# Patient Record
Sex: Female | Born: 1965 | Race: White | Hispanic: No | Marital: Married | Smoking: Former smoker
Health system: Southern US, Community
[De-identification: ages and names within clinical notes are randomized; demographics above are authoritative.]

## PROBLEM LIST (undated history)

## (undated) DIAGNOSIS — E611 Iron deficiency: Secondary | ICD-10-CM

## (undated) DIAGNOSIS — F32A Depression, unspecified: Secondary | ICD-10-CM

## (undated) DIAGNOSIS — F419 Anxiety disorder, unspecified: Secondary | ICD-10-CM

## (undated) DIAGNOSIS — I10 Essential (primary) hypertension: Secondary | ICD-10-CM

## (undated) DIAGNOSIS — G43909 Migraine, unspecified, not intractable, without status migrainosus: Secondary | ICD-10-CM

## (undated) DIAGNOSIS — D649 Anemia, unspecified: Secondary | ICD-10-CM

## (undated) DIAGNOSIS — D759 Disease of blood and blood-forming organs, unspecified: Secondary | ICD-10-CM

## (undated) DIAGNOSIS — G709 Myoneural disorder, unspecified: Secondary | ICD-10-CM

## (undated) DIAGNOSIS — J849 Interstitial pulmonary disease, unspecified: Secondary | ICD-10-CM

## (undated) DIAGNOSIS — I209 Angina pectoris, unspecified: Secondary | ICD-10-CM

## (undated) DIAGNOSIS — E785 Hyperlipidemia, unspecified: Secondary | ICD-10-CM

## (undated) DIAGNOSIS — F329 Major depressive disorder, single episode, unspecified: Secondary | ICD-10-CM

## (undated) DIAGNOSIS — R0602 Shortness of breath: Secondary | ICD-10-CM

## (undated) DIAGNOSIS — I2510 Atherosclerotic heart disease of native coronary artery without angina pectoris: Secondary | ICD-10-CM

## (undated) DIAGNOSIS — R011 Cardiac murmur, unspecified: Secondary | ICD-10-CM

## (undated) DIAGNOSIS — G8929 Other chronic pain: Secondary | ICD-10-CM

## (undated) DIAGNOSIS — M549 Dorsalgia, unspecified: Secondary | ICD-10-CM

## (undated) DIAGNOSIS — J45909 Unspecified asthma, uncomplicated: Secondary | ICD-10-CM

## (undated) DIAGNOSIS — Z87440 Personal history of urinary (tract) infections: Secondary | ICD-10-CM

## (undated) DIAGNOSIS — R42 Dizziness and giddiness: Secondary | ICD-10-CM

## (undated) DIAGNOSIS — J439 Emphysema, unspecified: Secondary | ICD-10-CM

## (undated) DIAGNOSIS — Z8614 Personal history of Methicillin resistant Staphylococcus aureus infection: Secondary | ICD-10-CM

## (undated) DIAGNOSIS — Z87442 Personal history of urinary calculi: Secondary | ICD-10-CM

## (undated) DIAGNOSIS — J449 Chronic obstructive pulmonary disease, unspecified: Secondary | ICD-10-CM

## (undated) DIAGNOSIS — J189 Pneumonia, unspecified organism: Secondary | ICD-10-CM

## (undated) DIAGNOSIS — K219 Gastro-esophageal reflux disease without esophagitis: Secondary | ICD-10-CM

## (undated) DIAGNOSIS — N951 Menopausal and female climacteric states: Secondary | ICD-10-CM

## (undated) DIAGNOSIS — T8859XA Other complications of anesthesia, initial encounter: Secondary | ICD-10-CM

## (undated) DIAGNOSIS — T4145XA Adverse effect of unspecified anesthetic, initial encounter: Secondary | ICD-10-CM

## (undated) HISTORY — DX: Hyperlipidemia, unspecified: E78.5

## (undated) HISTORY — PX: ABDOMINAL HYSTERECTOMY: SHX81

## (undated) HISTORY — DX: Depression, unspecified: F32.A

## (undated) HISTORY — DX: Iron deficiency: E61.1

## (undated) HISTORY — DX: Major depressive disorder, single episode, unspecified: F32.9

## (undated) HISTORY — DX: Atherosclerotic heart disease of native coronary artery without angina pectoris: I25.10

## (undated) HISTORY — DX: Anemia, unspecified: D64.9

## (undated) HISTORY — DX: Anxiety disorder, unspecified: F41.9

## (undated) HISTORY — DX: Migraine, unspecified, not intractable, without status migrainosus: G43.909

## (undated) HISTORY — DX: Other chronic pain: G89.29

## (undated) HISTORY — DX: Essential (primary) hypertension: I10

## (undated) HISTORY — DX: Personal history of Methicillin resistant Staphylococcus aureus infection: Z86.14

## (undated) HISTORY — DX: Cardiac murmur, unspecified: R01.1

## (undated) HISTORY — DX: Menopausal and female climacteric states: N95.1

## (undated) HISTORY — DX: Personal history of urinary (tract) infections: Z87.440

## (undated) HISTORY — DX: Pneumonia, unspecified organism: J18.9

## (undated) HISTORY — DX: Interstitial pulmonary disease, unspecified: J84.9

## (undated) HISTORY — DX: Dizziness and giddiness: R42

## (undated) HISTORY — DX: Personal history of urinary calculi: Z87.442

## (undated) HISTORY — DX: Dorsalgia, unspecified: M54.9

## (undated) HISTORY — PX: LUNG BIOPSY: SHX232

## (undated) HISTORY — DX: Gastro-esophageal reflux disease without esophagitis: K21.9

---

## 1987-07-28 HISTORY — PX: TONSILLECTOMY: SUR1361

## 1998-07-27 HISTORY — PX: OTHER SURGICAL HISTORY: SHX169

## 2003-07-28 HISTORY — PX: BREAST BIOPSY: SHX20

## 2004-07-27 HISTORY — PX: CHOLECYSTECTOMY: SHX55

## 2004-08-22 ENCOUNTER — Emergency Department: Payer: Self-pay | Admitting: Unknown Physician Specialty

## 2004-12-26 ENCOUNTER — Ambulatory Visit: Payer: Self-pay | Admitting: Urology

## 2005-01-19 ENCOUNTER — Emergency Department: Payer: Self-pay | Admitting: General Practice

## 2005-05-21 ENCOUNTER — Ambulatory Visit: Payer: Self-pay | Admitting: Family Medicine

## 2006-04-10 ENCOUNTER — Emergency Department: Payer: Self-pay | Admitting: Unknown Physician Specialty

## 2006-09-21 ENCOUNTER — Emergency Department: Payer: Self-pay | Admitting: Unknown Physician Specialty

## 2007-03-17 ENCOUNTER — Other Ambulatory Visit: Payer: Self-pay

## 2007-03-17 ENCOUNTER — Emergency Department: Payer: Self-pay | Admitting: Internal Medicine

## 2007-11-07 ENCOUNTER — Emergency Department: Payer: Self-pay | Admitting: Emergency Medicine

## 2007-12-30 ENCOUNTER — Other Ambulatory Visit: Payer: Self-pay

## 2007-12-30 ENCOUNTER — Emergency Department: Payer: Self-pay | Admitting: Emergency Medicine

## 2008-05-03 ENCOUNTER — Encounter: Payer: Self-pay | Admitting: Family Medicine

## 2008-06-05 ENCOUNTER — Emergency Department: Payer: Self-pay | Admitting: Emergency Medicine

## 2008-07-27 HISTORY — PX: CARDIAC CATHETERIZATION: SHX172

## 2008-08-21 ENCOUNTER — Ambulatory Visit: Payer: Self-pay | Admitting: Family Medicine

## 2008-08-21 DIAGNOSIS — G43909 Migraine, unspecified, not intractable, without status migrainosus: Secondary | ICD-10-CM | POA: Insufficient documentation

## 2008-08-21 DIAGNOSIS — F329 Major depressive disorder, single episode, unspecified: Secondary | ICD-10-CM

## 2008-08-21 DIAGNOSIS — D649 Anemia, unspecified: Secondary | ICD-10-CM

## 2008-08-21 DIAGNOSIS — F411 Generalized anxiety disorder: Secondary | ICD-10-CM | POA: Insufficient documentation

## 2008-08-21 DIAGNOSIS — F17200 Nicotine dependence, unspecified, uncomplicated: Secondary | ICD-10-CM

## 2008-08-21 DIAGNOSIS — E785 Hyperlipidemia, unspecified: Secondary | ICD-10-CM

## 2008-08-21 DIAGNOSIS — G5790 Unspecified mononeuropathy of unspecified lower limb: Secondary | ICD-10-CM | POA: Insufficient documentation

## 2008-08-24 ENCOUNTER — Ambulatory Visit: Payer: Self-pay | Admitting: Family Medicine

## 2008-08-24 LAB — CONVERTED CEMR LAB
ALT: 14 units/L (ref 0–35)
AST: 18 units/L (ref 0–37)
Alkaline Phosphatase: 56 units/L (ref 39–117)
BUN: 12 mg/dL (ref 6–23)
Bilirubin Urine: NEGATIVE
Bilirubin, Direct: 0.1 mg/dL (ref 0.0–0.3)
Calcium: 11.4 mg/dL — ABNORMAL HIGH (ref 8.4–10.5)
Creatinine, Ser: 1 mg/dL (ref 0.4–1.2)
Direct LDL: 213.5 mg/dL
Folate: 13.5 ng/mL
GFR calc Af Amer: 78 mL/min
HCT: 38 % (ref 36.0–46.0)
MCV: 95.6 fL (ref 78.0–100.0)
Nitrite: NEGATIVE
Phosphorus: 4.5 mg/dL (ref 2.3–4.6)
Potassium: 3.8 meq/L (ref 3.5–5.1)
RBC: 3.97 M/uL (ref 3.87–5.11)
TSH: 1.45 microintl units/mL (ref 0.35–5.50)
Triglycerides: 1088 mg/dL (ref 0–149)
Vitamin B-12: 438 pg/mL (ref 211–911)
WBC: 16.1 10*3/uL — ABNORMAL HIGH (ref 4.5–10.5)
Yeast, UA: 0
pH: 6

## 2008-08-25 ENCOUNTER — Encounter: Payer: Self-pay | Admitting: Family Medicine

## 2008-08-31 ENCOUNTER — Telehealth: Payer: Self-pay | Admitting: Family Medicine

## 2008-08-31 ENCOUNTER — Ambulatory Visit: Payer: Self-pay | Admitting: Family Medicine

## 2008-08-31 DIAGNOSIS — K219 Gastro-esophageal reflux disease without esophagitis: Secondary | ICD-10-CM | POA: Insufficient documentation

## 2008-08-31 DIAGNOSIS — D72829 Elevated white blood cell count, unspecified: Secondary | ICD-10-CM | POA: Insufficient documentation

## 2008-09-03 ENCOUNTER — Encounter: Payer: Self-pay | Admitting: Family Medicine

## 2008-09-06 ENCOUNTER — Ambulatory Visit: Payer: Self-pay | Admitting: Oncology

## 2008-09-06 LAB — CONVERTED CEMR LAB
Basophils Absolute: 0.1 10*3/uL (ref 0.0–0.1)
Eosinophils Absolute: 0.5 10*3/uL (ref 0.0–0.7)
HCT: 37.7 % (ref 36.0–46.0)
Hemoglobin: 12.3 g/dL (ref 12.0–15.0)
Lymphocytes Relative: 26 % (ref 12–46)
MCHC: 32.6 g/dL (ref 30.0–36.0)
MCV: 93.3 fL (ref 78.0–100.0)
Monocytes Absolute: 1.1 10*3/uL — ABNORMAL HIGH (ref 0.1–1.0)
Platelets: 451 10*3/uL — ABNORMAL HIGH (ref 150–400)
WBC: 17.2 10*3/uL — ABNORMAL HIGH (ref 4.0–10.5)

## 2008-09-18 ENCOUNTER — Encounter: Payer: Self-pay | Admitting: Family Medicine

## 2008-09-18 LAB — MORPHOLOGY - CHCC SATELLITE
Platelet Morphology: NORMAL
RBC Comments: NORMAL

## 2008-09-18 LAB — CMP (CANCER CENTER ONLY)
ALT(SGPT): 18 U/L (ref 10–47)
Albumin: 3.6 g/dL (ref 3.3–5.5)
CO2: 27 mEq/L (ref 18–33)
Calcium: 9.8 mg/dL (ref 8.0–10.3)
Chloride: 103 mEq/L (ref 98–108)
Glucose, Bld: 122 mg/dL — ABNORMAL HIGH (ref 73–118)
Potassium: 4.5 mEq/L (ref 3.3–4.7)
Sodium: 137 mEq/L (ref 128–145)
Total Protein: 8.1 g/dL (ref 6.4–8.1)

## 2008-09-18 LAB — CBC WITH DIFFERENTIAL (CANCER CENTER ONLY)
Eosinophils Absolute: 0.5 10*3/uL (ref 0.0–0.5)
HCT: 38.1 % (ref 34.8–46.6)
HGB: 13 g/dL (ref 11.6–15.9)
LYMPH%: 33.6 % (ref 14.0–48.0)
MCV: 91 fL (ref 81–101)
MONO#: 0.7 10*3/uL (ref 0.1–0.9)
NEUT%: 56.2 % (ref 39.6–80.0)
Platelets: 448 10*3/uL — ABNORMAL HIGH (ref 145–400)
RBC: 4.18 10*6/uL (ref 3.70–5.32)
WBC: 14 10*3/uL — ABNORMAL HIGH (ref 3.9–10.0)

## 2008-09-20 LAB — SPEP & IFE WITH QIG
Albumin ELP: 44.9 % — ABNORMAL LOW (ref 55.8–66.1)
Alpha-2-Globulin: 13.3 % — ABNORMAL HIGH (ref 7.1–11.8)
IgA: 170 mg/dL (ref 68–378)
IgG (Immunoglobin G), Serum: 1030 mg/dL (ref 694–1618)
Total Protein, Serum Electrophoresis: 7.8 g/dL (ref 6.0–8.3)

## 2008-09-20 LAB — IRON AND TIBC
%SAT: 12 % — ABNORMAL LOW (ref 20–55)
TIBC: 374 ug/dL (ref 250–470)

## 2008-09-20 LAB — FERRITIN: Ferritin: 25 ng/mL (ref 10–291)

## 2008-09-20 LAB — VITAMIN B12: Vitamin B-12: 738 pg/mL (ref 211–911)

## 2008-09-20 LAB — FOLATE: Folate: >20 ng/mL

## 2008-09-21 LAB — LACTATE DEHYDROGENASE: LDH: 118 U/L (ref 94–250)

## 2008-09-21 LAB — JAK2 GENOTYPR

## 2008-09-25 ENCOUNTER — Encounter: Payer: Self-pay | Admitting: Family Medicine

## 2008-09-27 ENCOUNTER — Ambulatory Visit: Payer: Self-pay | Admitting: Family Medicine

## 2008-09-27 LAB — UIFE/LIGHT CHAINS/TP QN, 24-HR UR
Albumin, U: DETECTED
Beta, Urine: DETECTED — AB
Free Kappa Lt Chains,Ur: 1.86 mg/dL — ABNORMAL HIGH (ref 0.04–1.51)
Free Lambda Excretion/Day: 2.67 mg/d
Free Lambda Lt Chains,Ur: 0.13 mg/dL (ref 0.08–1.01)
Free Lt Chn Excr Rate: 38.13 mg/d
Gamma Globulin, Urine: DETECTED — AB
Time: 24 hours
Volume, Urine: 2050 mL

## 2008-10-03 ENCOUNTER — Telehealth (INDEPENDENT_AMBULATORY_CARE_PROVIDER_SITE_OTHER): Payer: Self-pay | Admitting: *Deleted

## 2008-10-08 ENCOUNTER — Telehealth: Payer: Self-pay | Admitting: Family Medicine

## 2008-10-10 ENCOUNTER — Ambulatory Visit: Payer: Self-pay | Admitting: Family Medicine

## 2008-10-23 ENCOUNTER — Ambulatory Visit: Payer: Self-pay | Admitting: Oncology

## 2008-10-23 LAB — CBC WITH DIFFERENTIAL (CANCER CENTER ONLY)
Eosinophils Absolute: 0.3 10*3/uL (ref 0.0–0.5)
LYMPH#: 3 10*3/uL (ref 0.9–3.3)
MONO#: 0.7 10*3/uL (ref 0.1–0.9)
NEUT#: 7.5 10*3/uL — ABNORMAL HIGH (ref 1.5–6.5)
Platelets: 449 10*3/uL — ABNORMAL HIGH (ref 145–400)
RBC: 4.03 10*6/uL (ref 3.70–5.32)
WBC: 11.6 10*3/uL — ABNORMAL HIGH (ref 3.9–10.0)

## 2008-10-30 ENCOUNTER — Telehealth: Payer: Self-pay | Admitting: Family Medicine

## 2008-10-30 DIAGNOSIS — M549 Dorsalgia, unspecified: Secondary | ICD-10-CM | POA: Insufficient documentation

## 2008-11-05 ENCOUNTER — Ambulatory Visit: Payer: Self-pay | Admitting: Family Medicine

## 2008-11-05 ENCOUNTER — Encounter: Payer: Self-pay | Admitting: Family Medicine

## 2008-11-06 ENCOUNTER — Ambulatory Visit: Payer: Self-pay | Admitting: Family Medicine

## 2008-11-06 DIAGNOSIS — E559 Vitamin D deficiency, unspecified: Secondary | ICD-10-CM | POA: Insufficient documentation

## 2008-11-14 ENCOUNTER — Telehealth: Payer: Self-pay | Admitting: Family Medicine

## 2008-11-29 ENCOUNTER — Encounter: Payer: Self-pay | Admitting: Family Medicine

## 2008-12-05 ENCOUNTER — Telehealth: Payer: Self-pay | Admitting: Family Medicine

## 2008-12-20 ENCOUNTER — Telehealth: Payer: Self-pay | Admitting: Family Medicine

## 2008-12-28 ENCOUNTER — Ambulatory Visit: Payer: Self-pay | Admitting: Family Medicine

## 2008-12-28 DIAGNOSIS — L819 Disorder of pigmentation, unspecified: Secondary | ICD-10-CM

## 2009-01-01 LAB — CONVERTED CEMR LAB
ALT: 11 units/L (ref 0–35)
AST: 14 units/L (ref 0–37)
Alkaline Phosphatase: 102 units/L (ref 39–117)
HDL: 41 mg/dL (ref >39)
Indirect Bilirubin: 0.4 mg/dL (ref 0.0–0.9)
Potassium: 4.5 meq/L (ref 3.5–5.3)
Total CHOL/HDL Ratio: 6.9
Triglycerides: 332 mg/dL — ABNORMAL HIGH (ref <150)
VLDL: 66 mg/dL — ABNORMAL HIGH (ref 0–40)
Vit D, 25-Hydroxy: 32 ng/mL (ref 30–89)

## 2009-01-20 ENCOUNTER — Emergency Department: Payer: Self-pay | Admitting: Internal Medicine

## 2009-01-31 ENCOUNTER — Emergency Department: Payer: Self-pay | Admitting: Emergency Medicine

## 2009-02-08 ENCOUNTER — Telehealth: Payer: Self-pay | Admitting: Family Medicine

## 2009-04-12 ENCOUNTER — Ambulatory Visit: Payer: Self-pay | Admitting: Internal Medicine

## 2009-04-17 ENCOUNTER — Telehealth: Payer: Self-pay | Admitting: Family Medicine

## 2009-05-02 ENCOUNTER — Telehealth: Payer: Self-pay | Admitting: Family Medicine

## 2009-05-09 ENCOUNTER — Encounter: Payer: Self-pay | Admitting: Family Medicine

## 2009-06-06 ENCOUNTER — Encounter: Payer: Self-pay | Admitting: Family Medicine

## 2009-06-17 ENCOUNTER — Ambulatory Visit: Payer: Self-pay | Admitting: Otolaryngology

## 2009-07-01 ENCOUNTER — Ambulatory Visit: Payer: Self-pay | Admitting: Family Medicine

## 2009-07-01 DIAGNOSIS — I10 Essential (primary) hypertension: Secondary | ICD-10-CM

## 2009-07-02 ENCOUNTER — Ambulatory Visit: Payer: Self-pay | Admitting: Cardiovascular Disease

## 2009-07-02 ENCOUNTER — Inpatient Hospital Stay (HOSPITAL_COMMUNITY): Admission: AD | Admit: 2009-07-02 | Discharge: 2009-07-04 | Payer: Self-pay | Admitting: Cardiovascular Disease

## 2009-07-02 ENCOUNTER — Encounter: Payer: Self-pay | Admitting: Cardiovascular Disease

## 2009-07-04 ENCOUNTER — Encounter: Payer: Self-pay | Admitting: Cardiovascular Disease

## 2009-07-04 ENCOUNTER — Encounter (INDEPENDENT_AMBULATORY_CARE_PROVIDER_SITE_OTHER): Payer: Self-pay | Admitting: *Deleted

## 2009-07-10 ENCOUNTER — Telehealth: Payer: Self-pay | Admitting: Family Medicine

## 2009-07-12 ENCOUNTER — Encounter: Payer: Self-pay | Admitting: Cardiovascular Disease

## 2009-07-12 ENCOUNTER — Ambulatory Visit: Payer: Self-pay | Admitting: Internal Medicine

## 2009-07-12 ENCOUNTER — Emergency Department: Payer: Self-pay | Admitting: Unknown Physician Specialty

## 2009-07-15 ENCOUNTER — Ambulatory Visit: Payer: Self-pay | Admitting: Cardiovascular Disease

## 2009-07-24 DIAGNOSIS — I2510 Atherosclerotic heart disease of native coronary artery without angina pectoris: Secondary | ICD-10-CM

## 2009-07-27 DIAGNOSIS — J189 Pneumonia, unspecified organism: Secondary | ICD-10-CM

## 2009-07-27 HISTORY — DX: Pneumonia, unspecified organism: J18.9

## 2009-08-12 ENCOUNTER — Telehealth: Payer: Self-pay | Admitting: Family Medicine

## 2009-09-09 ENCOUNTER — Ambulatory Visit: Payer: Self-pay | Admitting: Cardiovascular Disease

## 2009-09-10 ENCOUNTER — Encounter: Payer: Self-pay | Admitting: Cardiovascular Disease

## 2009-09-11 LAB — CONVERTED CEMR LAB
AST: 15 units/L (ref 0–37)
Albumin: 4.4 g/dL (ref 3.5–5.2)
BUN: 14 mg/dL (ref 6–23)
CO2: 22 meq/L (ref 19–32)
Chloride: 101 meq/L (ref 96–112)
Cholesterol: 226 mg/dL — ABNORMAL HIGH (ref 0–200)
Glucose, Bld: 84 mg/dL (ref 70–99)
HDL: 48 mg/dL (ref >39)
Potassium: 4.4 meq/L (ref 3.5–5.3)
Sodium: 141 meq/L (ref 135–145)
Total CHOL/HDL Ratio: 4.7
Triglycerides: 528 mg/dL — ABNORMAL HIGH (ref <150)

## 2009-09-22 ENCOUNTER — Inpatient Hospital Stay (HOSPITAL_COMMUNITY): Admission: EM | Admit: 2009-09-22 | Discharge: 2009-09-27 | Payer: Self-pay | Admitting: Emergency Medicine

## 2009-10-09 ENCOUNTER — Ambulatory Visit: Payer: Self-pay | Admitting: Family Medicine

## 2009-10-15 ENCOUNTER — Telehealth: Payer: Self-pay | Admitting: Cardiovascular Disease

## 2009-10-18 ENCOUNTER — Telehealth: Payer: Self-pay | Admitting: Cardiovascular Disease

## 2009-10-18 ENCOUNTER — Ambulatory Visit: Payer: Self-pay | Admitting: Oncology

## 2009-10-23 ENCOUNTER — Encounter: Payer: Self-pay | Admitting: Family Medicine

## 2009-10-23 LAB — CBC WITH DIFFERENTIAL (CANCER CENTER ONLY)
BASO%: 1 % (ref 0.0–2.0)
EOS%: 2.9 % (ref 0.0–7.0)
HCT: 35.8 % (ref 34.8–46.6)
LYMPH#: 3.6 10*3/uL — ABNORMAL HIGH (ref 0.9–3.3)
MCHC: 33.9 g/dL (ref 32.0–36.0)
NEUT#: 4.4 10*3/uL (ref 1.5–6.5)
NEUT%: 49.4 % (ref 39.6–80.0)
Platelets: 382 10*3/uL (ref 145–400)
RDW: 13.2 % (ref 10.5–14.6)
WBC: 8.9 10*3/uL (ref 3.9–10.0)

## 2009-10-23 LAB — CMP (CANCER CENTER ONLY)
ALT(SGPT): 23 U/L (ref 10–47)
AST: 22 U/L (ref 11–38)
Calcium: 9.8 mg/dL (ref 8.0–10.3)
Chloride: 105 mEq/L (ref 98–108)
Creat: 1 mg/dl (ref 0.6–1.2)
Sodium: 137 mEq/L (ref 128–145)
Total Protein: 8.1 g/dL (ref 6.4–8.1)

## 2009-11-29 ENCOUNTER — Telehealth: Payer: Self-pay | Admitting: Family Medicine

## 2009-11-29 ENCOUNTER — Emergency Department (HOSPITAL_COMMUNITY): Admission: EM | Admit: 2009-11-29 | Discharge: 2009-11-29 | Payer: Self-pay | Admitting: Emergency Medicine

## 2010-01-02 ENCOUNTER — Telehealth: Payer: Self-pay | Admitting: Family Medicine

## 2010-01-06 ENCOUNTER — Ambulatory Visit: Payer: Self-pay | Admitting: Cardiovascular Disease

## 2010-01-09 ENCOUNTER — Telehealth: Payer: Self-pay | Admitting: Cardiovascular Disease

## 2010-01-20 ENCOUNTER — Ambulatory Visit: Payer: Self-pay | Admitting: Family Medicine

## 2010-01-20 ENCOUNTER — Telehealth: Payer: Self-pay | Admitting: Family Medicine

## 2010-01-29 ENCOUNTER — Telehealth: Payer: Self-pay | Admitting: Family Medicine

## 2010-01-30 ENCOUNTER — Telehealth: Payer: Self-pay | Admitting: Family Medicine

## 2010-02-06 ENCOUNTER — Emergency Department (HOSPITAL_COMMUNITY): Admission: EM | Admit: 2010-02-06 | Discharge: 2010-02-06 | Payer: Self-pay | Admitting: Emergency Medicine

## 2010-02-07 ENCOUNTER — Telehealth: Payer: Self-pay | Admitting: Family Medicine

## 2010-03-18 ENCOUNTER — Telehealth: Payer: Self-pay | Admitting: Cardiovascular Disease

## 2010-04-03 ENCOUNTER — Telehealth (INDEPENDENT_AMBULATORY_CARE_PROVIDER_SITE_OTHER): Payer: Self-pay | Admitting: *Deleted

## 2010-04-15 ENCOUNTER — Ambulatory Visit: Payer: Self-pay | Admitting: Cardiovascular Disease

## 2010-04-15 ENCOUNTER — Ambulatory Visit: Payer: Self-pay | Admitting: Family Medicine

## 2010-04-15 ENCOUNTER — Encounter: Payer: Self-pay | Admitting: Family Medicine

## 2010-04-15 DIAGNOSIS — R5381 Other malaise: Secondary | ICD-10-CM

## 2010-04-15 DIAGNOSIS — R5383 Other fatigue: Secondary | ICD-10-CM

## 2010-04-15 DIAGNOSIS — R609 Edema, unspecified: Secondary | ICD-10-CM

## 2010-04-16 ENCOUNTER — Encounter: Payer: Self-pay | Admitting: Family Medicine

## 2010-04-16 LAB — CONVERTED CEMR LAB
Albumin: 3.7 g/dL (ref 3.5–5.2)
Basophils Absolute: 0 10*3/uL (ref 0.0–0.1)
Basophils Relative: 0.4 % (ref 0.0–3.0)
Calcium: 9.5 mg/dL (ref 8.4–10.5)
Chloride: 99 meq/L (ref 96–112)
Creatinine, Ser: 1.1 mg/dL (ref 0.4–1.2)
Eosinophils Absolute: 0.1 10*3/uL (ref 0.0–0.7)
Eosinophils Relative: 1.1 % (ref 0.0–5.0)
Lymphocytes Relative: 26.6 % (ref 12.0–46.0)
MCHC: 33.4 g/dL (ref 30.0–36.0)
Neutrophils Relative %: 65.1 % (ref 43.0–77.0)
Platelets: 477 10*3/uL — ABNORMAL HIGH (ref 150.0–400.0)
Potassium: 3.4 meq/L — ABNORMAL LOW (ref 3.5–5.1)
RBC: 3.77 M/uL — ABNORMAL LOW (ref 3.87–5.11)
RDW: 15.5 % — ABNORMAL HIGH (ref 11.5–14.6)
TSH: 1.26 microintl units/mL (ref 0.35–5.50)
WBC: 12.8 10*3/uL — ABNORMAL HIGH (ref 4.5–10.5)

## 2010-04-17 ENCOUNTER — Telehealth: Payer: Self-pay | Admitting: Cardiovascular Disease

## 2010-04-17 ENCOUNTER — Ambulatory Visit: Payer: Self-pay | Admitting: Family Medicine

## 2010-04-17 ENCOUNTER — Telehealth: Payer: Self-pay | Admitting: Family Medicine

## 2010-04-17 LAB — CONVERTED CEMR LAB
AST: 16 units/L (ref 0–37)
Albumin: 4.3 g/dL (ref 3.5–5.2)
Bilirubin, Direct: <0.1 mg/dL (ref 0.0–0.3)
Cholesterol: 173 mg/dL (ref 0–200)
HDL: 47 mg/dL (ref >39)
Triglycerides: 238 mg/dL — ABNORMAL HIGH (ref <150)

## 2010-04-18 ENCOUNTER — Telehealth: Payer: Self-pay | Admitting: Family Medicine

## 2010-05-14 ENCOUNTER — Ambulatory Visit: Payer: Self-pay | Admitting: Family Medicine

## 2010-05-15 ENCOUNTER — Telehealth: Payer: Self-pay | Admitting: Family Medicine

## 2010-05-29 ENCOUNTER — Ambulatory Visit: Payer: Self-pay | Admitting: Family Medicine

## 2010-05-30 LAB — CONVERTED CEMR LAB: Fecal Occult Bld: NEGATIVE

## 2010-06-16 ENCOUNTER — Telehealth: Payer: Self-pay | Admitting: Family Medicine

## 2010-06-16 ENCOUNTER — Telehealth: Payer: Self-pay | Admitting: Cardiovascular Disease

## 2010-07-14 ENCOUNTER — Telehealth: Payer: Self-pay | Admitting: Family Medicine

## 2010-08-01 ENCOUNTER — Ambulatory Visit: Admit: 2010-08-01 | Payer: Self-pay | Admitting: Family Medicine

## 2010-08-06 ENCOUNTER — Other Ambulatory Visit: Payer: Self-pay | Admitting: Family Medicine

## 2010-08-06 ENCOUNTER — Telehealth: Payer: Self-pay | Admitting: Family Medicine

## 2010-08-06 ENCOUNTER — Encounter: Payer: Self-pay | Admitting: Family Medicine

## 2010-08-06 ENCOUNTER — Ambulatory Visit
Admission: RE | Admit: 2010-08-06 | Discharge: 2010-08-06 | Payer: Self-pay | Source: Home / Self Care | Attending: Family Medicine | Admitting: Family Medicine

## 2010-08-06 LAB — CBC WITH DIFFERENTIAL/PLATELET
Basophils Absolute: 0.1 10*3/uL (ref 0.0–0.1)
Basophils Relative: 0.4 % (ref 0.0–3.0)
Eosinophils Absolute: 0.1 10*3/uL (ref 0.0–0.7)
Eosinophils Relative: 0.7 % (ref 0.0–5.0)
HCT: 33.3 % — ABNORMAL LOW (ref 36.0–46.0)
Hemoglobin: 11 g/dL — ABNORMAL LOW (ref 12.0–15.0)
Lymphocytes Relative: 19.8 % (ref 12.0–46.0)
Lymphs Abs: 4.2 10*3/uL — ABNORMAL HIGH (ref 0.7–4.0)
MCHC: 33 g/dL (ref 30.0–36.0)
MCV: 85.2 fl (ref 78.0–100.0)
Monocytes Absolute: 1.2 10*3/uL — ABNORMAL HIGH (ref 0.1–1.0)
Monocytes Relative: 5.6 % (ref 3.0–12.0)
Neutro Abs: 15.7 10*3/uL — ABNORMAL HIGH (ref 1.4–7.7)
Neutrophils Relative %: 73.5 % (ref 43.0–77.0)
Platelets: 463 10*3/uL — ABNORMAL HIGH (ref 150.0–400.0)
RBC: 3.91 Mil/uL (ref 3.87–5.11)
RDW: 15.5 % — ABNORMAL HIGH (ref 11.5–14.6)
WBC: 22 10*3/uL (ref 4.5–10.5)

## 2010-08-06 LAB — BASIC METABOLIC PANEL
BUN: 7 mg/dL (ref 6–23)
CO2: 28 mEq/L (ref 19–32)
Calcium: 9.9 mg/dL (ref 8.4–10.5)
Chloride: 97 mEq/L (ref 96–112)
Creatinine, Ser: 1.1 mg/dL (ref 0.4–1.2)
GFR: 59.99 mL/min — ABNORMAL LOW (ref >60.00)
Glucose, Bld: 276 mg/dL — ABNORMAL HIGH (ref 70–99)
Potassium: 3.5 mEq/L (ref 3.5–5.1)
Sodium: 135 mEq/L (ref 135–145)

## 2010-08-06 LAB — TSH: TSH: 1.22 u[IU]/mL (ref 0.35–5.50)

## 2010-08-06 LAB — VITAMIN B12: Vitamin B-12: 276 pg/mL (ref 211–911)

## 2010-08-07 LAB — HEPATIC FUNCTION PANEL
ALT: 11 U/L (ref 0–35)
AST: 18 U/L (ref 0–37)
Albumin: 3.7 g/dL (ref 3.5–5.2)
Alkaline Phosphatase: 128 U/L — ABNORMAL HIGH (ref 39–117)
Bilirubin, Direct: 0.1 mg/dL (ref 0.0–0.3)
Total Bilirubin: 0.3 mg/dL (ref 0.3–1.2)
Total Protein: 7.6 g/dL (ref 6.0–8.3)

## 2010-08-07 LAB — HIGH SENSITIVITY CRP: CRP, High Sensitivity: 78.97 mg/L — ABNORMAL HIGH (ref 0.00–5.00)

## 2010-08-07 LAB — SEDIMENTATION RATE: Sed Rate: 78 mm/hr — ABNORMAL HIGH (ref 0–22)

## 2010-08-07 LAB — CK: Total CK: 320 U/L — ABNORMAL HIGH (ref 7–177)

## 2010-08-08 ENCOUNTER — Other Ambulatory Visit: Payer: Self-pay | Admitting: Family Medicine

## 2010-08-08 ENCOUNTER — Ambulatory Visit
Admission: RE | Admit: 2010-08-08 | Discharge: 2010-08-08 | Payer: Self-pay | Source: Home / Self Care | Attending: Family Medicine | Admitting: Family Medicine

## 2010-08-08 LAB — CBC WITH DIFFERENTIAL/PLATELET
Basophils Absolute: 0.1 10*3/uL (ref 0.0–0.1)
Basophils Relative: 0.4 % (ref 0.0–3.0)
Eosinophils Absolute: 0.2 10*3/uL (ref 0.0–0.7)
Eosinophils Relative: 1.3 % (ref 0.0–5.0)
HCT: 32.5 % — ABNORMAL LOW (ref 36.0–46.0)
Hemoglobin: 10.8 g/dL — ABNORMAL LOW (ref 12.0–15.0)
Lymphocytes Relative: 23.2 % (ref 12.0–46.0)
Lymphs Abs: 3.2 10*3/uL (ref 0.7–4.0)
MCHC: 33 g/dL (ref 30.0–36.0)
MCV: 85.1 fl (ref 78.0–100.0)
Monocytes Absolute: 0.6 10*3/uL (ref 0.1–1.0)
Monocytes Relative: 4.1 % (ref 3.0–12.0)
Neutro Abs: 9.8 10*3/uL — ABNORMAL HIGH (ref 1.4–7.7)
Neutrophils Relative %: 71 % (ref 43.0–77.0)
Platelets: 403 10*3/uL — ABNORMAL HIGH (ref 150.0–400.0)
RBC: 3.82 Mil/uL — ABNORMAL LOW (ref 3.87–5.11)
RDW: 15.9 % — ABNORMAL HIGH (ref 11.5–14.6)
WBC: 13.8 10*3/uL — ABNORMAL HIGH (ref 4.5–10.5)

## 2010-08-08 LAB — HIGH SENSITIVITY CRP: CRP, High Sensitivity: 118.95 mg/L — ABNORMAL HIGH (ref 0.00–5.00)

## 2010-08-08 LAB — CK: Total CK: 182 U/L — ABNORMAL HIGH (ref 7–177)

## 2010-08-09 ENCOUNTER — Encounter: Payer: Self-pay | Admitting: Family Medicine

## 2010-08-11 ENCOUNTER — Telehealth: Payer: Self-pay | Admitting: Family Medicine

## 2010-08-14 ENCOUNTER — Ambulatory Visit
Admission: RE | Admit: 2010-08-14 | Discharge: 2010-08-14 | Payer: Self-pay | Source: Home / Self Care | Attending: Cardiovascular Disease | Admitting: Cardiovascular Disease

## 2010-08-14 ENCOUNTER — Encounter: Payer: Self-pay | Admitting: Cardiovascular Disease

## 2010-08-15 ENCOUNTER — Ambulatory Visit: Admit: 2010-08-15 | Payer: Self-pay | Admitting: Family Medicine

## 2010-08-24 LAB — CONVERTED CEMR LAB
BUN: 9 mg/dL (ref 6–23)
Basophils Absolute: 0.2 10*3/uL — ABNORMAL HIGH (ref 0.0–0.1)
Basophils Relative: 1.8 % (ref 0.0–3.0)
Bilirubin, Direct: 0 mg/dL (ref 0.0–0.3)
CO2: 29 meq/L (ref 19–32)
Calcium, Total (PTH): 9.2 mg/dL (ref 8.4–10.5)
Chloride: 105 meq/L (ref 96–112)
Cholesterol: 324 mg/dL — ABNORMAL HIGH (ref 0–200)
Creatinine, Ser: 1 mg/dL (ref 0.4–1.2)
Direct LDL: 173.4 mg/dL
Eosinophils Absolute: 0.3 10*3/uL (ref 0.0–0.7)
Eosinophils Relative: 2.5 % (ref 0.0–5.0)
HCT: 37.3 % (ref 36.0–46.0)
Hemoglobin: 12.5 g/dL (ref 12.0–15.0)
LDL Cholesterol: 81 mg/dL (ref 0–99)
Lymphs Abs: 3 10*3/uL (ref 0.7–4.0)
Monocytes Absolute: 0.3 10*3/uL (ref 0.1–1.0)
Monocytes Relative: 2.3 % — ABNORMAL LOW (ref 3.0–12.0)
Neutro Abs: 7.4 10*3/uL (ref 1.4–7.7)
PTH: 40.9 pg/mL (ref 14.0–72.0)
Phosphorus: 4.4 mg/dL (ref 2.3–4.6)
RBC: 3.88 M/uL (ref 3.87–5.11)
RDW: 13.2 % (ref 11.5–14.6)
Sodium: 142 meq/L (ref 135–145)
TSH: 1.45 microintl units/mL (ref 0.35–5.50)
Total Bilirubin: 0.3 mg/dL (ref 0.3–1.2)
Total CHOL/HDL Ratio: 11
Total Protein: 7.7 g/dL (ref 6.0–8.3)
Triglycerides: 371 mg/dL — ABNORMAL HIGH (ref <150)
VLDL: 152.8 mg/dL — ABNORMAL HIGH (ref 0.0–40.0)
VLDL: 74 mg/dL — ABNORMAL HIGH (ref 0–40)

## 2010-08-27 HISTORY — PX: BONE MARROW BIOPSY: SHX199

## 2010-08-28 NOTE — Letter (Signed)
Summary: [facility]: Immunoassay Fecal Occult Blood (iFOB) Order Form  [place] [facility]  [address] [place], [place] [address]   Phone: [phone]  Fax: [phone]       Lab: Immunoassay Fecal Occult Blood (iFOB) Order Form   April 16, 2010 MRN: [id number]   Anna Pena [birth year]   Physicican Name:_____________Tower ____________  Diagnosis Code:_______anemia 285.9___________________      [name] MD

## 2010-08-28 NOTE — Progress Notes (Signed)
Summary: does patient need labs   Phone Note Call from Patient   Caller: Patient Call For: [name] MD Summary of Call: Patient called to schedule lab appt. She says that she was told to call and [facility]ule lab for cbc and sedrate. I don't see any mention of that any where. She has an appt to see you on friday, do you want her to come today for labs?  Initial call taken by: Melody Comas,  August 11, 2010 11:00 AM  Follow-up for Phone Call        no - we can do it on friday- can cancel labs today Follow-up by: [name] MD,  August 11, 2010 11:31 AM  Additional Follow-up for Phone Call Additional follow up Details #1::        Patient advised.Consuello Masse CMA   Additional Follow-up by: Benny Lennert CMA Duncan Dull),  August 11, 2010 11:51 AM

## 2010-08-28 NOTE — Progress Notes (Signed)
Summary: wants chest x-ray  Phone Note Call from Patient Call back at Home Phone [phone]   Caller: Patient Call For: [name] MD Summary of Call: Patient is asking if she can come in for a chest xray, hurting on right side, having sob, she thinks that her pneumonia is back. Some painful coughing. She says that she is trying to avoid going to ER. Can this be done? Initial call taken by: Melody Comas,  January 20, 2010 11:31 AM  Follow-up for Phone Call        needs to be evaluated by a physician today -- if no appts availible -- please go to [facility]  Follow-up by: [name] MD,  January 20, 2010 11:49 AM  Additional Follow-up for Phone Call Additional follow up Details #1::        Patient notified as instructed by telephone. Pt scheduled appt today at 12:30pm with Dr Hetty Ely.[name] LPN  January 20, 2010 11:55 AM

## 2010-08-28 NOTE — Progress Notes (Signed)
Summary: medication questions  Phone Note Call from Patient Call back at Home Phone [phone]   Caller: self Call For: Anna Pena Summary of Call: questions about increasing the Niaspan Initial call taken by: Harlon Flor,  January 09, 2010 10:35 AM  Follow-up for Phone Call        called pt made her aware that we are waiting to hear from Dr. Excell Seltzer.   Follow-up by: Benedict Needy, RN,  January 09, 2010 11:32 AM

## 2010-08-28 NOTE — Progress Notes (Signed)
Summary: wants referral to behavioral health  Phone Note Call from Patient Call back at Home Phone [phone]   Caller: Patient Call For: [name] MD Summary of Call: Pt wants referral to behavioral health center at [facility].  She would like to see either Dr. Maryruth Bun or Dr.Clapas.  She doesnt want to continue with Dr Evelene Croon.   Initial call taken by: Lowella Petties CMA, AAMA,  July 14, 2010 12:44 PM  Follow-up for Phone Call        will do ref for [facility]  Follow-up by: [name] MD,  July 14, 2010 1:21 PM  Additional Follow-up for Phone Call Additional follow up Details #1::        Patient notified as instructed by telephone. Pt said she did not need our office to do referral. Pt called herself and has appt with Dr. Sandria Senter 07/15/10 at 1;20PM. I will let Shirlee Limerick know.[name] LPN  July 14, 2010 2:31 PM     Additional Follow-up for Phone Call Additional follow up Details #2::    thanks  Follow-up by: [name] MD,  July 14, 2010 8:06 PM

## 2010-08-28 NOTE — Progress Notes (Signed)
Summary: elevated WBC  Phone Note From Other Clinic   Caller: [facility] @ [facility] Call For: Dr Sharen Hones Summary of Call: Patients WBC critical at 22,000. Lab is repeating. Initial call taken by: Mills Koller,  August 06, 2010 4:35 PM  Follow-up for Phone Call        I would ask for input from Dr. Reece Agar.  Follow-up by: Crawford Givens MD,  August 06, 2010 4:37 PM  Additional Follow-up for Phone Call Additional follow up Details #1::        Phoned Dr Sharen Hones with Lab result. Additional Follow-up by: Mills Koller,  August 06, 2010 4:41 PM    Additional Follow-up for Phone Call Additional follow up Details #2::    h/o elevated in past s/p w/u by heme thought to be reactive.  will await full set of blood work.  in office today, nontoxic, vitals stable, no documented fevers.  ? stress degranulation although high for this.  would want to see ANC as well as other values. rpt pending. Follow-up by: Eustaquio Boyden  MD,  August 06, 2010 5:05 PM

## 2010-08-28 NOTE — Assessment & Plan Note (Signed)
Summary: Former patient of Dr. Excell Seltzer   Visit Type:  Initial Consult Referring Provider:  Roxy Manns MD Primary Provider:  Colon Flattery Tower MD  CC:  c/o SOB everyday, feels like someone sitting on chest. Chest pains in ribs, mainly in middle of chest. Swelling in feet and hands, and can not get rings on and off. pt complains of fever and chills occasionally.Anna Pena  History of Present Illness: 45 year-old woman with diffuse small vessel CAD, Long history of smoking who stopped smoking 14 months ago, obesity with 20 pound weight gain over the past several months, anxiety/depression who presents for routine followup. She has had recent episodes of pneumonia several months ago.  she reports that she has severe stress at home. She has family members living in her house as they are homeless. These include her grandchildren. They will move out once they receive their tax return. She reports they are in considerate, causing severe stress to her life. She is tearful throughout her visit to our office. She has been unable to sleep secondary to stress. She takes Valium up to 4 tabs at a time and is unable to sleep. She also has a higher dose of Cymbalta which has not helped.  She has hand swelling, chronic shortness of breath, some recent episodes of chest pain. She is unable to perform a treadmill in the past as she had a panic attack. She does have a reaction to contrast.  EKG shows sinus tachycardia with rate of 100 beats per minute, no significant ST or T wave changes, prolonged QT with QTc 518 ms  Current Medications (verified): 1)  Metoprolol Tartrate 25 Mg Tabs (Metoprolol Tartrate) .Anna Pena.. 1 By Mouth Two Times A Day 2)  Neurontin 600 Mg Tabs (Gabapentin) .... 2 By Mouth Three Times A Day 3)  Valium 5 Mg Tabs (Diazepam) .... Take 4 At Night 4)  Cymbalta 60 Mg Cpep (Duloxetine Hcl) .... 30mg  By Mouth Three Times A Day 5)  Proair Hfa 108 (90 Base) Mcg/act Aers (Albuterol Sulfate) .... 2 Puffs Up To Every 4  Hours As Needed For Wheezing 6)  Crestor 40 Mg Tabs (Rosuvastatin Calcium) .... Take One Tablet By Mouth Daily. 7)  Fish Oil   Oil (Fish Oil) .... 2 Two Times A Day 8)  Tums 500 Mg Chew (Calcium Carbonate Antacid) .... As Needed 9)  Amlodipine Besylate 5 Mg Tabs (Amlodipine Besylate) .... Take One Tablet By Mouth Daily 10)  Aspirin 81 Mg Tabs (Aspirin) .... Take Two Tablets By Mouth At Bedtime 11)  Nitrostat 0.4 Mg Subl (Nitroglycerin) .... Use As Needed 12)  Spiriva Handihaler 18 Mcg Caps (Tiotropium Bromide Monohydrate) .Anna Pena.. 1 Inhalation Once Daily 13)  Niaspan 750 Mg Cr-Tabs (Niacin (Antihyperlipidemic)) .... Take 2 Tablets By Mouth Once A Day At Bedtime 14)  Zegerid 40-1100 Mg Caps (Omeprazole-Sodium Bicarbonate) .Anna Pena.. 1 By Mouth Once Daily 15)  Promethazine Hcl 25 Mg Supp (Promethazine Hcl) .Anna Pena.. 1 Suppository Pr Every 6 Hours As Needed For Nausea/vomiting. 16)  Vitamin D (Ergocalciferol) 50000 Unit Caps (Ergocalciferol) .... Take One Weekly X 8 Wks  Allergies (verified): 1)  ! Sulfa 2)  ! Erythromycin 3)  ! Imitrex 4)  ! Cipro 5)  ! Percocet 6)  ! * Ivp Dye 7)  ! * Shellfish  Past History:  Past Medical History: Last updated: 01/06/2010 CAD - diffuse multivessel, small vessel dz. Pneumonia, hospital acquired, 2011 Heart Murmur- no echo  High Blood pressure readings hx of Uti's Anemia-NOS Depression Hyperlipidemia, likely  familial Migraines Kidney Stones GERD Anxiety menopausal - not on hormones  vit D deficiency hyperlipidemia- triglycerides  chronic back pain chronic dizziness of ? etiol- with 1 ENT and 2 neuro work ups   cardiolExcell Seltzer  neuro  Past Surgical History: Last updated: 10/09/2009 (2006) Gall Bladder (2005) Breast bx (1989) Tonsillectomy (2000) Hysterectomy) total - for adhesions / endometriosis  2010 multivessel CAD- cath/ tx medically  Family History: Last updated: 07/02/2009 Fam Hx of Heart Disease (parent, Grandparent) Family History High  cholesterol ( Parent, Grandparent) Family History Hypertension ( Parent, Grandparent, other fam mem) Family History Diabetes 1st degree relative (parent / grandparant) Family History Kidney disease (grandparent) no cancer in family  no depression or anx in family  daughter with pseudotumor cerebri--age 28  mother MI at 47--age 52 now, 4 vessel CABG father has HBP and diabetes--age 28  Social History: Last updated: 08/06/2010 Married quit smoking 06/2009 Alcohol use-no Regular exercise-no G1P1 husband truck driver and works night  out of work for 2 years - used to work as dialysis tech   Risk Factors: Alcohol Use: 0 (07/02/2009) Caffeine Use: 5-6 (07/02/2009) Exercise: no (07/02/2009)  Risk Factors: Smoking Status: quit > 6 months (01/20/2010) Packs/Day: 1.0+ (01/20/2010)  Review of Systems       The patient complains of weight gain, syncope, and dyspnea on exertion.  The patient denies fever, weight loss, vision loss, decreased hearing, hoarseness, chest pain, peripheral edema, prolonged cough, abdominal pain, incontinence, muscle weakness, depression, and enlarged lymph nodes.         and swelling, anxious, depressed  Vital Signs:  Patient profile:   45 year old female Height:      65 inches Weight:      199.75 pounds BMI:     33.36 Pulse rate:   100 / minute BP supine:   128 / 84  (left arm) BP sitting:   127 / 85  (left arm) BP standing:   126 / 76  (left arm) Cuff size:   regular  Vitals Entered By: Lysbeth Galas CMA (August 14, 2010 3:29 PM)   Physical Exam  General:  depressed , tearful, anxious Head:  normocephalic and atraumatic Neck:  Neck supple, no JVD. No masses, thyromegaly or abnormal cervical nodes. Lungs:  Clear bilaterally to auscultation and percussion. Heart:  Non-displaced PMI, chest non-tender; regular rate and rhythm, S1, S2 without murmurs, rubs or gallops. Carotid upstroke normal, no bruit. Pedals normal pulses. No edema, no  varicosities. Abdomen:  Bowel sounds positive,abdomen soft and non-tender without masses, obese  Msk:  Back normal, normal gait. Muscle strength and tone normal. Pulses:  pulses normal in all 4 extremities Extremities:  No clubbing or cyanosis. Neurologic:  Alert and oriented x 3. Skin:  Intact without lesions or rashes. Psych:  is tearful and depressed.  talking about bad social situation at home.     Impression & Recommendations:  Problem # 1:  CHEST PAIN (ICD-786.50) I suspect her recent chest pain is noncardiac. It comes on at rest, in the setting of severe underlying social stressors. I suggested we wait on any workup as finances are a significant issue and she cannot afford stress testing or catheterization. I do not feel that these tests are needed given that her greatest complaint is her family members that will not move out the cause of your stress and her insomnia.  I have suggested that she continue to follow up with her counselor for further medication titration, increase her exercise.  Her updated medication list for this problem includes:    Metoprolol Tartrate 25 Mg Tabs (Metoprolol tartrate) .Anna Pena... 1 by mouth two times a day    Amlodipine Besylate 5 Mg Tabs (Amlodipine besylate) .Anna Pena... Take one tablet by mouth daily    Aspirin 81 Mg Tabs (Aspirin) .Anna Pena... Take two tablets by mouth at bedtime    Nitrostat 0.4 Mg Subl (Nitroglycerin) ..... Use as needed  Orders: EKG w/ Interpretation (93000)  Problem # 2:  SHORTNESS OF BREATH (ICD-786.05) I suspect some of her shortness of breath is from 20+ pound weight gain since she stopped smoking. I have suggested that she start watching her diet, increase her exercise.  Her updated medication list for this problem includes:    Metoprolol Tartrate 25 Mg Tabs (Metoprolol tartrate) .Anna Pena... 1 by mouth two times a day    Amlodipine Besylate 5 Mg Tabs (Amlodipine besylate) .Anna Pena... Take one tablet by mouth daily    Aspirin 81 Mg Tabs (Aspirin)  .Anna Pena... Take two tablets by mouth at bedtime  Problem # 3:  CAD, NATIVE VESSEL (ICD-414.01) continue aggressive medical management. Cholesterol is close to goal. Continue weight loss.  Will not add Zetia at this time secondary to financial issues. Increase aspirin to 81 mg x2.  Her updated medication list for this problem includes:    Metoprolol Tartrate 25 Mg Tabs (Metoprolol tartrate) .Anna Pena... 1 by mouth two times a day    Amlodipine Besylate 5 Mg Tabs (Amlodipine besylate) .Anna Pena... Take one tablet by mouth daily    Aspirin 81 Mg Tabs (Aspirin) .Anna Pena... Take two tablets by mouth at bedtime    Nitrostat 0.4 Mg Subl (Nitroglycerin) ..... Use as needed  Problem # 4:  HYPERTENSION, BENIGN (ICD-401.1) Blood pressure is reasonable on her current medication regimen.  Her updated medication list for this problem includes:    Metoprolol Tartrate 25 Mg Tabs (Metoprolol tartrate) .Anna Pena... 1 by mouth two times a day    Amlodipine Besylate 5 Mg Tabs (Amlodipine besylate) .Anna Pena... Take one tablet by mouth daily    Aspirin 81 Mg Tabs (Aspirin) .Anna Pena... Take two tablets by mouth at bedtime  Patient Instructions: 1)  Your physician recommends that you schedule a follow-up appointment in: 6 months 2)  Your physician recommends that you continue on your current medications as directed. Please refer to the Current Medication list given to you today. Prescriptions: NIASPAN 750 MG CR-TABS (NIACIN (ANTIHYPERLIPIDEMIC)) Take 2 tablets by mouth once a day at bedtime  #90 x 3   Entered by:   Lanny Hurst RN   Authorized by:   Dossie Arbour MD   Signed by:   Lanny Hurst RN on 08/14/2010   Method used:   Electronically to        [facility] [address] (retail)       [address], [address]       [place], [place]  [address]       Ph: [phone]       Fax: [phone]   RxID:   [id number] AMLODIPINE BESYLATE 5 MG TABS (AMLODIPINE BESYLATE) Take one tablet by mouth daily  #90 x 3   Entered by:    Lanny Hurst RN   Authorized by:   Dossie Arbour MD   Signed by:   Lanny Hurst RN on 08/14/2010   Method used:   Electronically to        [facility] [address] (retail)  [address]       [place], [place]  [address]       Ph: [phone]       Fax: [phone]   RxID:   [id number] METOPROLOL TARTRATE 25 MG TABS (METOPROLOL TARTRATE) 1 by mouth two times a day  #90 x 3   Entered by:   Lanny Hurst RN   Authorized by:   Dossie Arbour MD   Signed by:   Lanny Hurst RN on 08/14/2010   Method used:   Electronically to        [facility] [address] (retail)       [address], [address]       [place], [place]  [address]       Ph: [phone]       Fax: [phone]   RxID:   [id number] CRESTOR 40 MG TABS (ROSUVASTATIN CALCIUM) Take one tablet by mouth daily.  #30 x 6   Entered by:   Lanny Hurst RN   Authorized by:   Dossie Arbour MD   Signed by:   Lanny Hurst RN on 08/14/2010   Method used:   Electronically to        [facility] [address] (retail)       [address]       [place], [place]  [address]       Ph: [phone]       Fax: [phone]   RxID:   [id number]

## 2010-08-28 NOTE — Assessment & Plan Note (Signed)
Summary: SOB AND HX OF PNEUMONIA PER DR TOWER/RI   Vital Signs:  Patient profile:   45 year old female Weight:      198.50 pounds O2 Sat:      97 % on Room air Temp:     99.5 degrees F oral Pulse rate:   84 / minute Pulse rhythm:   regular BP sitting:   108 / 68  (right arm) Cuff size:   large  Vitals Entered By: Sydell Axon LPN (January 20, 2010 2:16 PM)  O2 Flow:  Room air CC: SOB, history of pneumonia and has coughed a few times   History of Present Illness: Pt here with husband for trouble of difficulty breathing and wha she describes as SOB...she quit smoking 12/6 and she had pneumonia 12/7 for three blockages and then had pneumonia 2/27, right sided. Today she has lots of " trouble on the right side"  She feelsw warm and no cough but she didn't last time either. No ear pain, no nasal dischsrge, mild upset stomach. She has SOB and  she has pain on the  right side of the chest which she relates to pneumonia because that is what she had at  the time she had hospitalization and pneumonia earlier this year.   Preventive Screening-Counseling & Management  Alcohol-Tobacco     Smoking Status: quit > 6 months     Packs/Day: 1.0+     Year Quit: 07/01/2010     Pack years: 1-11/2 x 25=  30+  Comments: Tried to discuss short term and long term effect of her prior smoking.  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 1 By Mouth Two Times A Day 2)  Neurontin 600 Mg Tabs (Gabapentin) .... 2 By Mouth Three Times A Day 3)  Prozac 20 Mg Caps (Fluoxetine Hcl) .... Take 4 Tablet By Mouth Once A Day 4)  Valium 5 Mg Tabs (Diazepam) .... Take One By Mouth Three Times A Day As Needed 5)  Proair Hfa 108 (90 Base) Mcg/act Aers (Albuterol Sulfate) .... 2 Puffs Up To Every 4 Hours As Needed For Wheezing 6)  Crestor 40 Mg Tabs (Rosuvastatin Calcium) .... Take One Tablet By Mouth Daily. 7)  Fish Oil   Oil (Fish Oil) .... 2 Two Times A Day 8)  Tums 500 Mg Chew (Calcium Carbonate Antacid) .... As Needed 9)  Amlodipine Besylate 5 Mg Tabs (Amlodipine Besylate) .... Take One Tablet By Mouth Daily 10)  Aspirin 81 Mg Tabs (Aspirin) .... Take Two Tablets By Mouth At Bedtime 11)  Nitrostat 0.4 Mg Subl (Nitroglycerin) .... Use As Needed 12)  Transdermal Patch 2" X 3"  Ptch (Transdermal Patch) .... Use As Needed 13)  Spiriva Handihaler 18 Mcg Caps (Tiotropium Bromide Monohydrate) .Marland KitchenMarland KitchenMarland Kitchen 1  Inhalation Once Daily 14)  Niaspan 750 Mg Cr-Tabs (Niacin (Antihyperlipidemic)) .... Take 2 Tablets By Mouth Once A Day At Bedtime  Allergies: 1)  ! Sulfa 2)  ! Erythromycin 3)  ! Imitrex 4)  ! Cipro 5)  ! Percocet 6)  ! * Ivp Dye 7)  ! * Shellfish 8)  ! * Shellfish  Social History: Smoking Status:  quit > 6 months Packs/Day:  1.0+  Review of Systems Resp:  Complains of chest discomfort, chest pain with inspiration, and shortness of breath; denies cough, coughing up blood, sputum productive, and wheezing.  Physical Exam  General:  Well-developed,well-nourished,in no acute distress; alert,appropriate and cooperative throughout examination, with husband and is somehat agitated. Becomes SOB if questioned for prolonged period. Head:  normocephalic, atraumatic, and no abnormalities  observed.   Eyes:  Conjunctiva clear bilaterally.  Ears:  R ear normal and L ear normal except TMs slightly dull to LR.Marland Kitchen   Nose:  no nasal discharge.   Mouth:  pharynx pink and moist.   Neck:  supple with full rom and no masses or thyromegally, no JVD or carotid bruit  Lungs:  diffusely distant bs, reaonably clear to auscultation. slt prolonged exp phase  not sob Heart:  Normal rate and regular rhythm. S1 and S2 normal without gallop, murmur, click, rub or other extra sounds.   Impression & Recommendations:  Problem # 1:  SHORTNESS OF BREATH (ICD-786.05) Assessment New Sounds clear to auscultation. Pt can't afford her  Spiriva. Has used Albuterol twice today. Tried to explain past smoking exposure risk. Will get CXR.  Orders: CXR- 2view (CXR) CXR looks stable w/o acute infiltrate.  Given sample of Spiriva and samples of Advair. Can't afford Spiriva which i preferable...suggest she get online and cha ck the manufacturer for indigent program. In the meantime, try Advair which we tend to have more of tio see if helpful.(Given samps times three, thus three weeks worth.)  Problem # 2:  PNEUMONIA, RIGHT (ICD-486) Assessment: Improved No acute infiltrate seen.   Problem # 3:  HYPERTENSION, BENIGN (ICD-401.1) Assessment: Improved Well controlled today. Cont curr meds. Her updated medication list for this problem includes:    Metoprolol Tartrate 25 Mg Tabs (Metoprolol tartrate) .Marland Kitchen... 1 by mouth two times a day    Amlodipine Besylate 5 Mg Tabs (Amlodipine besylate) .Marland Kitchen... Take one tablet by mouth daily  BP today: 108/68 Prior BP: 147/82 (01/06/2010)  Labs Reviewed: K+: 4.4 (09/10/2009) Creat: : 0.96 (09/10/2009)   Chol: 202 (01/06/2010)   HDL: 47 (01/06/2010)   LDL: 81 (01/06/2010)   TG: 371 (01/06/2010)  Problem # 4:  ANXIETY (ICD-300.00) Assessment: Unchanged Tried to explain her medical problems and poss solutions. She seemed better adjusted when leaving. Her updated medication list  for this problem includes:    Prozac 20 Mg Caps (Fluoxetine hcl) .Marland Kitchen... Take 4 tablet by mouth once a day    Valium 5 Mg Tabs (Diazepam) .Marland Kitchen... Take one by mouth three times a day as needed  Complete Medication List: 1)  Metoprolol Tartrate 25 Mg Tabs (Metoprolol tartrate) .Marland Kitchen.. 1 by mouth two times a day 2)  Neurontin 600 Mg Tabs (Gabapentin) .... 2 by mouth three times a day 3)  Prozac 20 Mg Caps (Fluoxetine hcl) .... Take 4 tablet by mouth once a day 4)  Valium 5 Mg Tabs (Diazepam) .... Take one by mouth three times a day as needed 5)  Proair Hfa 108 (90 Base) Mcg/act Aers (Albuterol sulfate) .... 2 puffs up to  every 4 hours as needed for wheezing 6)  Crestor 40 Mg Tabs (Rosuvastatin calcium) .... Take one tablet by mouth daily. 7)  Fish Oil Oil (Fish oil) .... 2 two times a day 8)  Tums 500 Mg Chew (Calcium carbonate antacid) .... As needed 9)  Amlodipine Besylate 5 Mg Tabs (Amlodipine besylate) .... Take one tablet by mouth daily 10)  Aspirin 81 Mg Tabs (Aspirin) .... Take two tablets by mouth at bedtime 11)  Nitrostat 0.4 Mg Subl (Nitroglycerin) .... Use as needed 12)  Transdermal Patch 2" X 3" Ptch (Transdermal patch) .... Use as needed 13)  Spiriva Handihaler 18 Mcg Caps (Tiotropium bromide monohydrate) .Marland Kitchen.. 1 inhalation once daily 14)  Niaspan 750 Mg Cr-tabs (Niacin (antihyperlipidemic)) .... Take 2 tablets by mouth once a day at bedtime  Patient Instructions: 1)  RTC as needed. Call if sxs worsen. 2)  35 mins spent with pt.  Current Allergies (reviewed today): ! SULFA ! ERYTHROMYCIN ! IMITREX ! CIPRO ! PERCOCET ! * IVP DYE ! * SHELLFISH ! * SHELLFISH

## 2010-08-28 NOTE — Progress Notes (Signed)
Summary: pt has fever  Phone Note Call from Patient Call back at Home Phone [phone]   Caller: Patient Call For: [name] MD Summary of Call: Pt's grandson has been dx'd with pneumonia- viral. Pt states she woke up this morning with fever of 101.8.  She is not coughing.  She is asking if there is anything she should do. Initial call taken by: Lowella Petties CMA,  January 30, 2010 12:01 PM  Follow-up for Phone Call        needs f/u with first avail or go to UC please  needs evaluation Follow-up by: [name] MD,  January 30, 2010 12:26 PM  Additional Follow-up for Phone Call Additional follow up Details #1::        Patient notified as instructed by telephone. Pt does not have vehicle now but later today will go to UC.[name] LPN  January 31, 980 1:07 PM

## 2010-08-28 NOTE — Assessment & Plan Note (Signed)
Summary: DIZZY,NO ENERGY,S.O.B./CLE   Vital Signs:  Patient profile:   45 year old female Weight:      201.50 pounds O2 Sat:      97 % on Room air Temp:     99.0 degrees F oral Pulse rate:   88 / minute Pulse (ortho):   86 / minute Pulse rhythm:   regular BP sitting:   110 / 70  (left arm) BP standing:   110 / 70 Cuff size:   large  Vitals Entered By: Selena Batten Dance CMA (AAMA) (August 06, 2010 12:43 PM)  O2 Flow:  Room air  Serial Vital Signs/Assessments:  Time      Position  BP       Pulse  Resp  Temp     By 12:43 PM  Lying LA  114/72   86                    Kim Dance CMA (AAMA) 12:43 PM  Sitting   110/70   88                    Kim Dance CMA (AAMA) 12:43 PM  Standing  110/70   86                    Kim Dance CMA (AAMA)   History of Present Illness: CC: "i'm tired of feeling like i have no energy, feeling bad"  quit smoking 13 mo ago, feeling bad since then.  staying in bed, getting fussed at by husband.  "nobody believes i feel as bad as i do".  Daughter and family living with her since May - significant stressor because pt "OCD" and daughter "slob".  Also scared because mother had bypass at age 38yo, pt about to turn 38 and has some CAD hx.    GERD/HH.  nexium worked well but was not covered by [employer].  zegerid was helping but needing 2 so stopped taking altogether.  Now taking 5-10 tums/night and OTC omeprazole 20mg  5-6 at a time.  feels choking from reflux.  history of HH per patient.  hasn't seen GI.  h/o double PNA 03/2010, dx with CAD Gwynneth Macleod).  09/22/2009 - bacterial PNA as well.  states feels like did when had PNA.  psych - Dr. Betsy Pries, pt told has chronic depression.  on cymbalta 30 three times a day as well as valium 20mg  nightly.  told doesn't have bipolar.  endorses subjective fever last night as well as chills.  mild cough productive of flegm.  no documented fever.  today in clinic 99.  + pain under R breast bone, stabbing.  + dizzy with standing up  fast.  No energy.  + hurting all over "even hair".  muscle pain going on for >3 mo.  h/o chronic dizziness.  + polydipsia.  no polyuria or phagia or weight loss.  Current Medications (verified): 1)  Metoprolol Tartrate 25 Mg Tabs (Metoprolol Tartrate) .Marland Kitchen.. 1 By Mouth Two Times A Day 2)  Neurontin 600 Mg Tabs (Gabapentin) .... 2 By Mouth Three Times A Day 3)  Valium 5 Mg Tabs (Diazepam) .... Take 4 At Night 4)  Cymbalta 60 Mg Cpep (Duloxetine Hcl) .... 30mg  By Mouth Three Times A Day 5)  Proair Hfa 108 (90 Base) Mcg/act Aers (Albuterol Sulfate) .... 2 Puffs Up To Every 4 Hours As Needed For Wheezing 6)  Crestor 40 Mg Tabs (Rosuvastatin Calcium) .... Take One Tablet By  Mouth Daily. 7)  Fish Oil   Oil (Fish Oil) .... 2 Two Times A Day 8)  Tums 500 Mg Chew (Calcium Carbonate Antacid) .... As Needed 9)  Amlodipine Besylate 5 Mg Tabs (Amlodipine Besylate) .... Take One Tablet By Mouth Daily 10)  Aspirin 81 Mg Tabs (Aspirin) .... Take Two Tablets By Mouth At Bedtime 11)  Nitrostat 0.4 Mg Subl (Nitroglycerin) .... Use As Needed 12)  Spiriva Handihaler 18 Mcg Caps (Tiotropium Bromide Monohydrate) .Marland Kitchen.. 1 Inhalation Once Daily 13)  Niaspan 750 Mg Cr-Tabs (Niacin (Antihyperlipidemic)) .... Take 2 Tablets By Mouth Once A Day At Bedtime 14)  Zegerid 40-1100 Mg Caps (Omeprazole-Sodium Bicarbonate) .Marland Kitchen.. 1 By Mouth Once Daily 15)  Promethazine Hcl 25 Mg Supp (Promethazine Hcl) .Marland Kitchen.. 1 Suppository Pr Every 6 Hours As Needed For Nausea/vomiting.  Allergies: 1)  ! Sulfa 2)  ! Erythromycin 3)  ! Imitrex 4)  ! Cipro 5)  ! Percocet 6)  ! * Ivp Dye 7)  ! * Shellfish  Past History:  Past Medical History: Last updated: 01/06/2010 CAD - diffuse multivessel, small vessel dz. Pneumonia, hospital acquired, 2011 Heart Murmur- no echo  High Blood pressure readings hx of Uti's Anemia-NOS Depression Hyperlipidemia, likely familial Migraines Kidney Stones GERD Anxiety menopausal - not on hormones  vit D  deficiency hyperlipidemia- triglycerides  chronic back pain chronic dizziness of ? etiol- with 1 ENT and 2 neuro work ups   cardiolExcell Seltzer  neuro  Past Surgical History: Last updated: 10/09/2009 (2006) Gall Bladder (2005) Breast bx (1989) Tonsillectomy (2000) Hysterectomy) total - for adhesions / endometriosis  2010 multivessel CAD- cath/ tx medically  Social History: Married quit smoking 06/2009 Alcohol use-no Regular exercise-no G1P1 husband truck driver and works night  out of work for 2 years - used to work as Insurance account manager   Review of Systems       per HPI  Physical Exam  General:  depressed , tearful, anxious Head:  normocephalic, atraumatic, and no abnormalities observed.  Eyes:  vision grossly intact, pupils equal, pupils round, and pupils reactive to light.  no conjunctival pallor, injection or icterus  Ears:  R ear normal and L ear normal.   Nose:  no nasal discharge.   Mouth:  pharynx pink and moist.  + cobblestoning and some discharge. Neck:  supple with full rom and no masses or thyromegally, no JVD or carotid bruit  Lungs:  Normal respiratory effort, chest expands symmetrically. Lungs are clear to auscultation, no crackles or wheezes. Heart:  Normal rate and regular rhythm. S1 and S2 normal without gallop, murmur, click, rub or other extra sounds. Msk:  no acute joint changes Pulses:  2+ rad pulses Extremities:  no pedal edema bilaterally Skin:  Intact without suspicious lesions or rashes nl color and turgor  Psych:  is tearful and depressed.  talking about bad social situation at home.  easily tears up with husband  no SI   Impression & Recommendations:  Problem # 1:  CHEST PAIN (ICD-786.50) not cardiac sounding.  anticipate depression/psych playing large part in presentation today.    Orders: T-2 View CXR (71020TC)  Problem # 2:  SHORTNESS OF BREATH (ICD-786.05) given pt states feels like she did when last PNA, check CXR - wnl.  vitals  stable, O2 normal at 97% RA.  ? anxiety/depression playing component.  Orders: T-2 View CXR (71020TC)  Problem # 3:  FATIGUE (ICD-780.79) multifactorial with stress and depression A1c was 7.4%, will check sugar again to ensure  not too elevated causing feeling sick/polydipsia.  likely will need antihyperglycemic medication.  f/u with Dr. Milinda Antis.   ?fibro given c/o nonspecific msk pain throughout body, going on for >3 mo.  did not do trigger point testing.  discussed in differential.  Orders: TLB-CBC Platelet - w/Differential (85025-CBCD) TLB-TSH (Thyroid Stimulating Hormone) (84443-TSH) TLB-BMP (Basic Metabolic Panel-BMET) (80048-METABOL) Venipuncture (81191) TLB-B12, Serum-Total ONLY (47829-F62) T-Vitamin D (25-Hydroxy) (13086-57846)  Problem # 4:  DEPRESSION (ICD-311) alot on her plate.  discussed how when unable to relieve stress in healthy ways, can cause worsening pain, as well as deterioration of other problems.  Her updated medication list for this problem includes:    Valium 5 Mg Tabs (Diazepam) .Marland Kitchen... Take 4 at night    Cymbalta 60 Mg Cpep (Duloxetine hcl) ..... 30mg  by mouth three times a day  Problem # 5:  GERD (ICD-530.81) provided with dexilant samples (may be better covered by [place]?).  refilled zegerid 40mg  for daily use.  if not covered, advised to start taking 20mg  OTC, 2 daily.  but needs daily medication for optimal control.  given h/o HH, may beneft from GI eval?  didn't find h/o HH in chart but per pt there.  Her updated medication list for this problem includes:    Tums 500 Mg Chew (Calcium carbonate antacid) .Marland Kitchen... As needed    Zegerid 40-1100 Mg Caps (Omeprazole-sodium bicarbonate) .Marland Kitchen... 1 by mouth once daily  Complete Medication List: 1)  Metoprolol Tartrate 25 Mg Tabs (Metoprolol tartrate) .Marland Kitchen.. 1 by mouth two times a day 2)  Neurontin 600 Mg Tabs (Gabapentin) .... 2 by mouth three times a day 3)  Valium 5 Mg Tabs (Diazepam) .... Take 4 at night 4)  Cymbalta  60 Mg Cpep (Duloxetine hcl) .... 30mg  by mouth three times a day 5)  Proair Hfa 108 (90 Base) Mcg/act Aers (Albuterol sulfate) .... 2 puffs up to every 4 hours as needed for wheezing 6)  Crestor 40 Mg Tabs (Rosuvastatin calcium) .... Take one tablet by mouth daily. 7)  Fish Oil Oil (Fish oil) .... 2 two times a day 8)  Tums 500 Mg Chew (Calcium carbonate antacid) .... As needed 9)  Amlodipine Besylate 5 Mg Tabs (Amlodipine besylate) .... Take one tablet by mouth daily 10)  Aspirin 81 Mg Tabs (Aspirin) .... Take two tablets by mouth at bedtime 11)  Nitrostat 0.4 Mg Subl (Nitroglycerin) .... Use as needed 12)  Spiriva Handihaler 18 Mcg Caps (Tiotropium bromide monohydrate) .Marland Kitchen.. 1 inhalation once daily 13)  Niaspan 750 Mg Cr-tabs (Niacin (antihyperlipidemic)) .... Take 2 tablets by mouth once a day at bedtime 14)  Zegerid 40-1100 Mg Caps (Omeprazole-sodium bicarbonate) .Marland Kitchen.. 1 by mouth once daily 15)  Promethazine Hcl 25 Mg Supp (Promethazine hcl) .Marland Kitchen.. 1 suppository pr every 6 hours as needed for nausea/vomiting.  Patient Instructions: 1)  Please return for follow up with Dr. Milinda Antis in 2-3 wks on how you're feeling. 2)  We have checked chest xray today - looking normal 3)  we have checked blood work today.  will call you with results. 4)  For reflux - provided with samples for dexilant.  update [place] on how that helps.  I've sent in refill for zegerid 40mg  once daily.  if not covered, start over the counter zegerid 20mg , 2 daily.  Update Dr. Milinda Antis with how you're doing. 5)  We are out of Spiriva samples. 6)  Good to meet you today, I hope you start feeling better. Prescriptions: AMLODIPINE BESYLATE 5 MG TABS (  AMLODIPINE BESYLATE) Take one tablet by mouth daily  #30 x 1   Entered and Authorized by:   Eustaquio Boyden  MD   Signed by:   Eustaquio Boyden  MD on 08/06/2010   Method used:   Electronically to        [facility] [address] (retail)       [address], [address]       [place], [place]  [address]       Ph: [phone]       Fax: [phone]   RxID:   [id number] METOPROLOL TARTRATE 25 MG TABS (METOPROLOL TARTRATE) 1 by mouth two times a day  #60 x 1   Entered and Authorized by:   Eustaquio Boyden  MD   Signed by:   Eustaquio Boyden  MD on 08/06/2010   Method used:   Electronically to        [facility] [address] (retail)       [address], [address]       [place], [place]  [address]       Ph: [phone]       Fax: [phone]   RxID:   [id number]    Orders Added: 1)  T-2 View CXR [71020TC] 2)  Est. Patient Level IV [06301] 3)  TLB-CBC Platelet - w/Differential [85025-CBCD] 4)  TLB-TSH (Thyroid Stimulating Hormone) [84443-TSH] 5)  TLB-BMP (Basic Metabolic Panel-BMET) [80048-METABOL] 6)  Venipuncture [36415] 7)  TLB-B12, Serum-Total ONLY [82607-B12] 8)  T-Vitamin D (25-Hydroxy) [60109-32355]    Current Allergies (reviewed today): ! SULFA ! ERYTHROMYCIN ! IMITREX ! CIPRO ! PERCOCET ! * IVP DYE ! * SHELLFISH

## 2010-08-28 NOTE — Assessment & Plan Note (Signed)
Summary: ROV   Visit Type:  Follow-up Referring Provider:  Roxy Manns MD Primary Provider:  [name] MD  CC:  Hospitalized for bacterial pneumonia on Feb.27 and D/C on March 4th....still c/o breathing problems and has discussed with PCP. Has chest  and shoulder pain with tingling in arms..  History of Present Illness: This is a 45 year-old woman with diffuse small vessel CAD, presenting today for follow-up evaluation. She has preserved LV function. She has quit smoking and was applauded for this - notes significant weight gain over the past several months.  She has episodic chest pain, but nonexertional and in a stable pattern. Denies syncope, edema, orthopnea, or PND. She does have exercise intolerance and chronic dyspnea. She had severe hospital-acquired pneumonia a few months ago and is still recovering from this.  Current Medications (verified): 1)  Metoprolol Tartrate 25 Mg Tabs (Metoprolol Tartrate) .Marland Kitchen.. 1 By Mouth Two Times A Day 2)  Neurontin 600 Mg Tabs (Gabapentin) .... 2 By Mouth Three Times A Day 3)  Prozac 20 Mg Caps (Fluoxetine Hcl) .... Take 4 Tablet By Mouth Once A Day 4)  Valium 5 Mg Tabs (Diazepam) .... Take One By Mouth Three Times A Day As Needed 5)  Proair Hfa 108 (90 Base) Mcg/act Aers (Albuterol Sulfate) .... 2 Puffs Up To Every 4 Hours As Needed For Wheezing 6)  Crestor 40 Mg Tabs (Rosuvastatin Calcium) .... Take One Tablet By Mouth Daily. 7)  Fish Oil   Oil (Fish Oil) .... 2 Two Times A Day 8)  Tums 500 Mg Chew (Calcium Carbonate Antacid) .... As Needed 9)  Amlodipine Besylate 5 Mg Tabs (Amlodipine Besylate) .... Take One Tablet By Mouth Daily 10)  Niaspan 1000 Mg Cr-Tabs (Niacin (Antihyperlipidemic)) .Marland Kitchen.. 1 Tablet Daily At Bedtime 30 Minutes After Aspirin 11)  Aspirin 81 Mg Tabs (Aspirin) .... Take Two Tablets By Mouth At Bedtime 12)  Nitrostat 0.4 Mg Subl (Nitroglycerin) .... Use As Needed 13)  Transdermal Patch 2" X 3"  Ptch (Transdermal Patch) .... Use  As Needed 14)  Spiriva Handihaler 18 Mcg Caps (Tiotropium Bromide Monohydrate) .Marland Kitchen.. 1 Inhalation Once Daily  Allergies (verified): 1)  ! Sulfa 2)  ! Erythromycin 3)  ! Imitrex 4)  ! Cipro 5)  ! Percocet 6)  ! * Ivp Dye 7)  ! * Shellfish 8)  ! * Shellfish  Past History:  Past medical history reviewed for relevance to current acute and chronic problems.  Past Medical History: CAD - diffuse multivessel, small vessel dz. Pneumonia, hospital acquired, 2011 Heart Murmur- no echo  High Blood pressure readings hx of Uti's Anemia-NOS Depression Hyperlipidemia, likely familial Migraines Kidney Stones GERD Anxiety menopausal - not on hormones  vit D deficiency hyperlipidemia- triglycerides  chronic back pain chronic dizziness of ? etiol- with 1 ENT and 2 neuro work ups   cardiol- Anna Pena  neuro  Review of Systems       Positive for anxiety and depressed mood, otherwise negative except as per HPI   Vital Signs:  Patient profile:   45 year old female Height:      65 inches Weight:      199 pounds BMI:     33.24 Pulse rate:   78 / minute Resp:     20 per minute BP sitting:   147 / 82  (left arm) Cuff size:   regular  Vitals Entered By: Bishop Dublin, CMA (January 06, 2010 2:44 PM)  Physical Exam  General:  Pt is  alert and oriented, tearful at times, in no acute distress. HEENT: normal Neck: normal carotid upstrokes without bruits, JVP normal Lungs: CTA CV: RRR without murmur or gallop Abd: soft, NT, positive BS, no bruit, no organomegaly Ext: no clubbing, cyanosis, or edema. peripheral pulses 2+ and equal Skin: warm and dry without rash    EKG  Procedure date:  01/06/2010  Findings:      NSR, Right IVCD, nonspecific T wave abnormality, otherwise WNL  Impression & Recommendations:  Problem # 1:  CAD, NATIVE VESSEL (ICD-414.01) Stable at present without angina. We discussed continuing current med therapy and slowly increasing exercise as she recovers from  pneumonia. Goal for weight loss set over the next several months.  Her updated medication list for this problem includes:    Metoprolol Tartrate 25 Mg Tabs (Metoprolol tartrate) .Marland Kitchen... 1 by mouth two times a day    Amlodipine Besylate 5 Mg Tabs (Amlodipine besylate) .Marland Kitchen... Take one tablet by mouth daily    Aspirin 81 Mg Tabs (Aspirin) .Marland Kitchen... Take two tablets by mouth at bedtime    Nitrostat 0.4 Mg Subl (Nitroglycerin) ..... Use as needed  Orders: EKG w/ Interpretation (93000)  Problem # 2:  HYPERTENSION, BENIGN (ICD-401.1) Assessment: Unchanged Follow and continue to monitor BP - will not change meds based on single elevated reading today but will need to watch.  Her updated medication list for this problem includes:    Metoprolol Tartrate 25 Mg Tabs (Metoprolol tartrate) .Marland Kitchen... 1 by mouth two times a day    Amlodipine Besylate 5 Mg Tabs (Amlodipine besylate) .Marland Kitchen... Take one tablet by mouth daily    Aspirin 81 Mg Tabs (Aspirin) .Marland Kitchen... Take two tablets by mouth at bedtime  BP today: 147/82 Prior BP: 118/80 (10/09/2009)  Labs Reviewed: K+: 4.4 (09/10/2009) Creat: : 0.96 (09/10/2009)   Chol: 226 (09/10/2009)   HDL: 48 (09/10/2009)   LDL: See Comment mg/dL (09/81/1914)   TG: 782 (09/10/2009)  Problem # 3:  HYPERLIPIDEMIA (ICD-272.4) Due for lipids and lft's, especially since on dual therapy with crestor and niaspan. Only mild flushing on niaspan (tolerable).  Her updated medication list for this problem includes:    Crestor 40 Mg Tabs (Rosuvastatin calcium) .Marland Kitchen... Take one tablet by mouth daily.    Niaspan 1000 Mg Cr-tabs (Niacin (antihyperlipidemic)) .Marland Kitchen... 1 tablet daily at bedtime 30 minutes after aspirin  Orders: T-Lipid Profile (95621-30865) T-Hepatic Function [phone])  CHOL: 226 (09/10/2009)   LDL: See Comment mg/dL (84/13/2440)   HDL: 48 (09/10/2009)   TG: 528 (09/10/2009)  Patient Instructions: 1)  Your physician recommends that you schedule a follow-up appointment in: 4  months 2)  Your physician recommends that you return for a FASTING lipid profile: Next week 3)  Your physician recommends that you continue on your current medications as directed. Please refer to the Current Medication list given to you today.

## 2010-08-28 NOTE — Letter (Signed)
Summary: doctors  doctors   Imported By: Rosine Beat 04/17/2010 14:52:45  _____________________________________________________________________  External Attachment:    Type:   Image     Comment:   External Document

## 2010-08-28 NOTE — Progress Notes (Signed)
Summary: PAIN MEDS  Phone Note Call from Patient Call back at Home Phone [phone]   Caller: SELF Call For: [facility] Summary of Call: PT WAS DIAGNOSED WITH PNEUMONIA YESTERDAY-HAS A BUZZING IN HER EARS-WAS GIVEN BIAXIN-SHE IS WANTING SOMETHING FOR PAIN Initial call taken by: Harlon Flor,  April 17, 2010 11:58 AM  Follow-up for Phone Call        Dr. Mariah Milling does not write prescriptions for pain meds she will need to contact her PCP.  Follow-up by: Benedict Needy, RN,  April 17, 2010 12:27 PM

## 2010-08-28 NOTE — Progress Notes (Signed)
Summary: Pt went to [facility] walk in clinic  ---- Converted from flag ---- ---- 04/17/2010 4:16 PM, Sydell Axon LPN wrote: Jeanene Erb patient to give her test results. Patient was at the walk-in clinic at [facility] being seen for ear pain. Was informed that the doctor was trying to give her a prescription and could not get into the chart until notes were signed off in EMR by this office. Informed the patient and Dr. Severiano Gilbert that Dr. Milinda Antis had approved Vicodin for patient. Dr. Severiano Gilbert said that she was going to probably give patient Meclizine. Patient got back on the phone and did request that the Vicodin be called in. Rena called the prescription in as requested by patient. Sydell Axon, LPN  thanks - I appreciate it  ------------------------------

## 2010-08-28 NOTE — Assessment & Plan Note (Signed)
Summary: SEVERE EAR PAIN,PNEUMONIA/JBB   Vital Signs:  Patient Profile:   45 Years Old Female CC:      severe ear pain and pneumonia Height:     65 inches Weight:      196 pounds BMI:     32.73 O2 Sat:      100 % O2 treatment:    Room Air Temp:     97.8 degrees F oral Pulse rate:   77 / minute Pulse rhythm:   regular Resp:     16 per minute BP sitting:   133 / 88  (left arm)  Pt. in pain?   no                   Current Allergies: ! SULFA ! ERYTHROMYCIN ! IMITREX ! CIPRO ! PERCOCET ! * IVP DYE ! * SHELLFISH ! * SHELLFISHHistory of Present Illness History from: patient Reason for visit: see chief complaint Chief Complaint: severe ear pain and pneumonia History of Present Illness: Patient presents today reporting that she has had several bouts of pneumonia and everytime she has this concurrently she develops ringing in her ears and sharp pain that radiates from left to right. She reports that she has been referred to "the best neurologist and they didn't find anything". She has also been referred to ENT with similar response. She feels that no one believes her, including her daughter because she continually receives referrals.   We had long discussion regarding need for referring, but patient did not want to hear that. She insists that she wanted to change to have our clinic as her PCP, but I suggested that she needed a referral as well.  She was diagnosed with new bibasilar infiltrates on xray and given Biaxin. She feels that the Biaxin is not working.  REVIEW OF SYSTEMS Constitutional Symptoms       Complains of fever, chills, weight gain, and fatigue.     Denies night sweats and weight loss.  Eyes       Complains of change in vision and glasses.      Denies eye pain, eye discharge, contact lenses, and eye surgery. Ear/Nose/Throat/Mouth       Complains of change in hearing, ear pain, and dizziness.      Denies hearing loss/aids, ear discharge, frequent runny nose,  frequent nose bleeds, sinus problems, sore throat, hoarseness, and tooth pain or bleeding.  Respiratory       Complains of wheezing, shortness of breath, asthma, and bronchitis.      Denies dry cough, productive cough, and emphysema/COPD.  Cardiovascular       Complains of murmurs, tires easily with exhertion, and edema.      Denies chest pain and edema.    Gastrointestinal       Complains of nausea/vomiting and indigestion.      Denies stomach pain, diarrhea, constipation, and blood in bowel movements. Genitourniary       Denies painful urination, blood or discharge from vagina, kidney stones, and loss of urinary control. Neurological       Complains of headaches, loss of or changes in sensation, numbness, and tingling.      Denies paralysis, seizures, and fainting/blackouts. Musculoskeletal       Denies muscle pain, joint pain, joint stiffness, decreased range of motion, redness, swelling, muscle weakness, and gout.  Skin       Denies bruising, unusual mles/lumps or sores, and hair/skin or nail changes.  Psych  Complains of mood changes, anxiety/stress, and sleep problems.      Denies temper/anger issues, speech problems, and depression. Blood-Lymph       Complains of easily bruises or bleeds. PMH-FH-SH reviewed for relevance  Family History: Reviewed history from 07/02/2009 and no changes required. Fam Hx of Heart Disease (parent, Grandparent) Family History High cholesterol ( Parent, Grandparent) Family History Hypertension ( Parent, Grandparent, other fam mem) Family History Diabetes 1st degree relative (parent / grandparant) Family History Kidney disease (grandparent) no cancer in family  no depression or anx in family  daughter with pseudotumor cerebri--age 42  mother MI at 47--age 73 now, 4 vessel CABG father has HBP and diabetes--age 37  Social History: Reviewed history from 07/02/2009 and no changes required. Married Current Smoker Alcohol use-no Regular  exercise-no G1P1 husband works night  out of work for 2 years - used to work as Insurance account manager   Physical Exam General appearance: well developed, well nourished, no acute distress Eyes: conjunctivae and lids normal Pupils: equal, round, reactive to light Ears: normal, no lesions or deformities Oral/Pharynx: tachy mucous membranes Neck: neck supple,  trachea midline, no masses Chest/Lungs: no rales, wheezes, or rhonchi bilateral, breath sounds equal without effort Heart: regular rate and  rhythm, no murmur Extremities: normal extremities Neurological: grossly intact and non-focal Skin: no obvious rashes or lesions MSE: oriented to time, place, and person - Pt with labile emotions with occ tears.  Noted: patient was in my office for 20-30 minutes without coughing.  Assessment New Problems: TINNITUS, SUBJECTIVE (ICD-388.31)   Patient Education: Patient and/or caregiver instructed in the following: rest fluids and Tylenol, diet, exercise.  Plan New Orders: Est. Patient Level III [id number] Planning Comments:   She had called her PCP the morning of this visit asking for pain medications and something for cough. Then she presented here. I was considering meclizine and re-referral, but patient decided to have the prescription called in from her PCP for Vicodin. I suggested that she continue to be followed by her PCP since she is already known to that office. We will take care of any of her urgent care needs.  Follow Up: Follow up with Primary Physician  The patient and/or caregiver has been counseled thoroughly with regard to medications prescribed including dosage, schedule, interactions, rationale for use, and possible side effects and they verbalize understanding.  Diagnoses and expected course of recovery discussed and will return if not improved as expected or if the condition worsens. Patient and/or caregiver verbalized understanding.   Orders Added: 1)  Est. Patient Level III  [[address]] More than 20 minutes spent with patient in discussion.   The risks, benefits and possible side effects of the treatments and tests were explained clearly to the patient and the patient verbalized understanding.  The patient was informed that there is no on-call Dajsha Massaro or services available at this clinic during off-hours (when the clinic is closed).  If the patient developed a problem or concern that required immediate attention, the patient was advised to go the the nearest available urgent care or emergency department for medical care.  The patient verbalized understanding.    The patient has been informed that he/she needs to obtain a primary care physician for completeness and better continuity of care. We are a conveinent care facility and not a primary care office.

## 2010-08-28 NOTE — Progress Notes (Signed)
Summary: call a nurse  Phone Note Call from Patient   Call For: [name] MD Summary of Call: Triage Record Num: [id number] Operator: Albertine Grates Patient Name: Anna Pena Call Date & Time: 01/28/2010 9:12:50PM Patient Phone: [phone] PCP: Idamae Schuller A. Kathy Wahid Patient Gender: Female PCP Fax : Patient DOB: Apr 09, 1966 Practice Name: [facility] Reason for Call: Pt's grandson has been diagnosed with viral pneumonia 7-5. Pt. is concerned as she had bacterial pneumonia in March. Pt has no symptoms. Was told to follow up with MD since had pneumonai previously. Advised antibiotics usually not prescribed for viral pneumonia. Advised follow up with MD 7-6. Protocol(s) Used: Information Only Calls, No Triage Recommended Outcome per Protocol: Provide Information or Advice Only Reason for Outcome: Health Information question and no triage Care Advice:  ~ 07/ Initial call taken by: Melody Comas,  January 29, 2010 10:59 AM

## 2010-08-28 NOTE — Assessment & Plan Note (Signed)
Summary: F/U,COLD/CLE   Vital Signs:  Patient profile:   45 year old female Height:      65 inches Weight:      197.50 pounds BMI:     32.98 Temp:     98.2 degrees F oral Pulse rate:   84 / minute Pulse rhythm:   regular BP sitting:   100 / 68  (left arm) Cuff size:   large  Vitals Entered By: [name] LPN (April 15, 2010 9:47 AM) CC: alot of stressm, SOB on and off, non productive cough, buzz in ears when runs fever. Had pneumonia in July.   History of Present Illness: here for stress -- not been good  daughter and kids had to move in with her  husband is working really hard to support both families a lot of problems with finances may loose her home  will try to get on food stamps   chest pain gets worse when excited or stressed  ran out of nitroglycerin - needs some  ? if exertional  gets the headache and some sweaty - not nauseated    sob on and off - aware she had pneumonia in july and may in R side  on spiriva  smoking status -- still a non smoker 9 mo  more trouble with her chest  has not seen her cardiologist  is having a bit of cough -- nothing is coming up  yesterday had a headache   depression/ anx - meds called her psychiatrist -- and talked to family about severe  still on her meds -- has weaned off some meds and likes cymbalta   cannot afford a counselor unfortunately   lipids good in june on current meds has CAD 1 vessel  neuropathy  leukocytosis imp oin dec  gerd -- cannot afford her nexium (that helps)  zegerid works -- is on that   is thirsty at time     Allergies: 1)  ! Sulfa 2)  ! Erythromycin 3)  ! Imitrex 4)  ! Cipro 5)  ! Percocet 6)  ! * Ivp Dye 7)  ! * Shellfish 8)  ! * Shellfish  Past History:  Past Medical History: Last updated: 01/06/2010 CAD - diffuse multivessel, small vessel dz. Pneumonia, hospital acquired, 2011 Heart Murmur- no echo  High Blood pressure readings hx of  Uti's Anemia-NOS Depression Hyperlipidemia, likely familial Migraines Kidney Stones GERD Anxiety menopausal - not on hormones  vit D deficiency hyperlipidemia- triglycerides  chronic back pain chronic dizziness of ? etiol- with 1 ENT and 2 neuro work ups   cardiolExcell Seltzer  neuro  Past Surgical History: Last updated: 10/09/2009 (2006) Gall Bladder (2005) Breast bx (1989) Tonsillectomy (2000) Hysterectomy) total - for adhesions / endometriosis  2010 multivessel CAD- cath/ tx medically  Family History: Last updated: 07/02/2009 Fam Hx of Heart Disease (parent, Grandparent) Family History High cholesterol ( Parent, Grandparent) Family History Hypertension ( Parent, Grandparent, other fam mem) Family History Diabetes 1st degree relative (parent / grandparant) Family History Kidney disease (grandparent) no cancer in family  no depression or anx in family  daughter with pseudotumor cerebri--age 70  mother MI at 47--age 75 now, 4 vessel CABG father has HBP and diabetes--age 61  Social History: Last updated: 07/02/2009 Married Current Smoker Alcohol use-no Regular exercise-no G1P1 husband works night  out of work for 2 years - used to work as dialysis tech   Risk Factors: Alcohol Use: 0 (07/02/2009) Caffeine Use: 5-6 (07/02/2009) Exercise: no (07/02/2009)  Risk Factors: Smoking Status: quit > 6 months (01/20/2010) Packs/Day: 1.0+ (01/20/2010)  Review of Systems General:  Complains of fatigue; denies fever, loss of appetite, and malaise. Eyes:  Denies blurring, discharge, and eye irritation. ENT:  Denies sinus pressure and sore throat. CV:  Denies chest pain or discomfort, palpitations, and shortness of breath with exertion. Resp:  Complains of cough and sputum productive; denies shortness of breath. GI:  Denies abdominal pain, bloody stools, and change in bowel habits. GU:  Denies dysuria. MS:  Complains of muscle aches. Derm:  Denies rash. Neuro:  Complains  of headaches; denies tingling and visual disturbances. Psych:  Complains of anxiety and depression; denies suicidal thoughts/plans. Endo:  Denies cold intolerance, excessive thirst, excessive urination, and heat intolerance. Heme:  Denies abnormal bruising and bleeding.  Physical Exam  General:  depressed , tearful, anxious Head:  normocephalic, atraumatic, and no abnormalities observed.  no sinus tenderness Eyes:  vision grossly intact, pupils equal, pupils round, and pupils reactive to light.  no conjunctival pallor, injection or icterus  Ears:  R ear normal and L ear normal.   Nose:  no nasal discharge.   Mouth:  pharynx pink and moist.   Neck:  supple with full rom and no masses or thyromegally, no JVD or carotid bruit  Chest Wall:  No deformities, masses, or tenderness noted. Lungs:  diffusely distant bs, reaonably clear to auscultation. slt prolonged exp phase  not sob Heart:  Normal rate and regular rhythm. S1 and S2 normal without gallop, murmur, click, rub or other extra sounds. Abdomen:  Bowel sounds positive,abdomen soft and non-tender without masses, organomegaly or hernias noted. no renal bruits  Msk:  no acute joint changes LS and musculature surrounding it is tender   Pulses:  plus one pedal pulses Extremities:  No clubbing, cyanosis, edema, or deformity noted with normal full range of motion of all joints.   no pitting edema noted  Neurologic:  sensation intact to light touch, gait normal, and DTRs symmetrical and normal.  (gait is slow due to back pain) Skin:  Intact without suspicious lesions or rashes nl color and turgor  Cervical Nodes:  No lymphadenopathy noted Inguinal Nodes:  No significant adenopathy Psych:  is tearful and depressed - with pressured speech talking about bad social situation at home and how poorly her family treats her  no SI however   Impression & Recommendations:  Problem # 1:  EDEMA (ICD-782.3) Assessment New pt c/o edema - non pitting  and sob check bnp today EKG no change  she cannot afford cardiology visit now will update with results adv to go to eR if worse Orders: Venipuncture (08657) TLB-Renal Function Panel (80069-RENAL) TLB-CBC Platelet - w/Differential (85025-CBCD) TLB-TSH (Thyroid Stimulating Hormone) (84443-TSH) TLB-BNP (B-Natriuretic Peptide) (83880-BNPR)  Problem # 2:  FATIGUE (ICD-780.79) Assessment: Deteriorated multifactorial with stress and depression labs today Orders: Venipuncture (84696) TLB-Renal Function Panel (80069-RENAL) TLB-CBC Platelet - w/Differential (85025-CBCD) TLB-TSH (Thyroid Stimulating Hormone) (84443-TSH) TLB-BNP (B-Natriuretic Peptide) (83880-BNPR)  Problem # 3:  SHORTNESS OF BREATH (ICD-786.05) Assessment: Deteriorated cxr today hx smoking and past pneumonia  Orders: Venipuncture (29528) TLB-Renal Function Panel (80069-RENAL) TLB-CBC Platelet - w/Differential (85025-CBCD) TLB-TSH (Thyroid Stimulating Hormone) (84443-TSH) TLB-BNP (B-Natriuretic Peptide) (83880-BNPR) T-2 View CXR (71020TC)  Problem # 4:  DEPRESSION (ICD-311) Assessment: Deteriorated needs psych f/u- is worse  needs counseling- cannot afford it  The following medications were removed from the medication list:    Prozac 20 Mg Caps (Fluoxetine hcl) .Marland Kitchen... Take 4 tablet by  mouth once a day Her updated medication list for this problem includes:    Valium 5 Mg Tabs (Diazepam) .Marland Kitchen... Take one by mouth three times a day as needed    Cymbalta 60 Mg Cpep (Duloxetine hcl) .Marland Kitchen... Take one pill by mouth in am  Problem # 5:  CHEST PAIN (ICD-786.50) Assessment: New atypical that worsens with stress but not exertion has known angina and CAD- out of nitro- urged to refil that no change on todays EKG- baseline T wave inversions  will f/u with cardio when she can afford will keep working on risk factors and anxiety  Complete Medication List: 1)  Metoprolol Tartrate 25 Mg Tabs (Metoprolol tartrate) .Marland Kitchen.. 1 by mouth  two times a day 2)  Neurontin 600 Mg Tabs (Gabapentin) .... 2 by mouth three times a day 3)  Valium 5 Mg Tabs (Diazepam) .... Take one by mouth three times a day as needed 4)  Proair Hfa 108 (90 Base) Mcg/act Aers (Albuterol sulfate) .... 2 puffs up to every 4 hours as needed for wheezing 5)  Crestor 40 Mg Tabs (Rosuvastatin calcium) .... Take one tablet by mouth daily. 6)  Fish Oil Oil (Fish oil) .... 2 two times a day 7)  Tums 500 Mg Chew (Calcium carbonate antacid) .... As needed 8)  Amlodipine Besylate 5 Mg Tabs (Amlodipine besylate) .... Take one tablet by mouth daily 9)  Aspirin 81 Mg Tabs (Aspirin) .... Take two tablets by mouth at bedtime 10)  Nitrostat 0.4 Mg Subl (Nitroglycerin) .... Use as needed 11)  Spiriva Handihaler 18 Mcg Caps (Tiotropium bromide monohydrate) .Marland Kitchen.. 1 inhalation once daily 12)  Niaspan 750 Mg Cr-tabs (Niacin (antihyperlipidemic)) .... Take 2 tablets by mouth once a day at bedtime 13)  Cymbalta 60 Mg Cpep (Duloxetine hcl) .... Take one pill by mouth in am 14)  Temazepam 30 Mg Caps (Temazepam) .... Take 1 capsule by mouth once a day at bedtime 15)  Zegerid 40-1100 Mg Caps (Omeprazole-sodium bicarbonate) .Marland Kitchen.. 1 by mouth once daily  Patient Instructions: 1)  chest xray today  2)  if cough/ fever let me know  3)  follow up with your cardiologist when you can afford  4)  pick up nitroglycerin from pharmacy 5)  labs today   Current Allergies (reviewed today): ! SULFA ! ERYTHROMYCIN ! IMITREX ! CIPRO ! PERCOCET ! * IVP DYE ! * SHELLFISH ! * SHELLFISH

## 2010-08-28 NOTE — Progress Notes (Signed)
Summary: wants phenergan suppositories  Phone Note Call from Patient Call back at Home Phone [phone]   Caller: Patient Summary of Call: Pt went to [facility] on friday because she had frequent vomiting and diarrhea. She was admitted and found to have low potassium.  She still has a lot of nausea and is asking for phenergan suppositories  to be called to [facility] [address]. Initial call taken by: Lowella Petties CMA, AAMA,  June 16, 2010 1:07 PM    New/Updated Medications: PROMETHAZINE HCL 25 MG SUPP (PROMETHAZINE HCL) 1 suppository PR every 6 hours as needed for nausea/vomiting. Prescriptions: PROMETHAZINE HCL 25 MG SUPP (PROMETHAZINE HCL) 1 suppository PR every 6 hours as needed for nausea/vomiting.  #10 x 0   Entered and Authorized by:   Ruthe Mannan MD   Signed by:   Ruthe Mannan MD on 06/16/2010   Method used:   Electronically to        [facility] [address] (retail)       [address]       [place], [place]  [address]       Ph: [phone]       Fax: [phone]   RxID:   [id number]

## 2010-08-28 NOTE — Letter (Signed)
Summary: medications  medications   Imported By: Rosine Beat 04/17/2010 14:50:13  _____________________________________________________________________  External Attachment:    Type:   Image     Comment:   External Document

## 2010-08-28 NOTE — Progress Notes (Signed)
Summary: requests something for cough  Phone Note Call from Patient Call back at Home Phone [phone]   Caller: Patient Call For: Anna Pena Summary of Call: Pt states she is coughing so hard that she is vomiting.  She is asking if something for cough can be called to [facility] [address].  Please call pt and let her know. Initial call taken by: Lowella Petties CMA,  April 17, 2010 8:51 AM  Follow-up for Phone Call        can she take codiene or hydrocodone (I note intolerance to percocet on med list )- let me know  Follow-up by: Anna Pena,  April 17, 2010 9:44 AM  Additional Follow-up for Phone Call Additional follow up Details #1::        Pt called back and reports that she has a terrible pain in both ears- hears a chirping from one ear to the other.  She says she always gets this when she gets pneumonia.  She said not to worrry about sending anything in for the cough, says she can take robitussin.  Now she is concerned about the pain in her ears.  She says she can take hydrocodone. Additional Follow-up by: Lowella Petties CMA,  April 17, 2010 10:50 AM    Additional Follow-up for Phone Call Additional follow up Details #2::    I am already treating her for pneumonia with biaxin and this should also cover ear infection --so that is re-assuring  if she wants to see someone put her in for first avail since I am full- or UC if her symptoms are severe Follow-up by: Anna Pena,  April 17, 2010 10:59 AM  Additional Follow-up for Phone Call Additional follow up Details #3:: Details for Additional Follow-up Action Taken: Patient notified as instructed by telephone. Pt said she cannot afford another co pay now and if she is not better with ear pain she will call back tomorrow.[name] LPN  April 17, 2010 11:53 AM    Appended Document: requests something for cough Pt wants to know if Dr Milinda Antis will please call in med for pain. She said she just  saw Dr Milinda Antis and can't afford to see someone else.Please advise.   Appended Document: requests something for cough if she can take hydrocodone can do small amt of that for short period -- it will help cough too - this has potential for addiction and also sedation so use with caution  she really needs to be evaluated seperately for this becasue I simply do not have a reason for her ears to be hurting like this and we had so many issues to cover at the last visit ... px written on EMR for call in  follow up please-- so we can spend visit on this particular problem and try to come up with a plan to figure it out I am aware she was given options for payment plan and copay waiver over the phone and she still declined visit  px written on EMR for call in  if worse after hours needs to go to ER   Medication phoned to [facility] [address] pharmacy as instructed. See additional phone note.[name] LPN  April 17, 2010 3:58 PM   Clinical Lists Changes  Medications: Added new medication of VICODIN 5-500 MG TABS (HYDROCODONE-ACETAMINOPHEN) 1 by mouth up to every 6 hours as needed severe pain - Signed Rx of VICODIN 5-500 MG TABS (HYDROCODONE-ACETAMINOPHEN) 1 by mouth up  to every 6 hours as needed severe pain;  #10 x 0;  Signed;  Entered by: Sydell Axon LPN;  Authorized by: Anna Pena;  Method used: Telephoned to [facility] [address] [address], [place], [place], [place]  [address], Ph: [phone], Fax: [phone]    Prescriptions: VICODIN 5-500 MG TABS (HYDROCODONE-ACETAMINOPHEN) 1 by mouth up to every 6 hours as needed severe pain  #10 x 0   Entered by:   Sydell Axon LPN   Authorized by:   Anna Pena   Signed by:   Sydell Axon LPN on 13/02/6577   Method used:   Telephoned to ...       [facility] [address] (retail)       [address]       [place], [place]  [address]       Ph: [phone]       Fax:  [phone]   RxID:   [id number]

## 2010-08-28 NOTE — Progress Notes (Signed)
Summary: ? UTI  Phone Note Call from Patient   Caller: Patient Call For: [name] MD Summary of Call: Pt is complaining of urinary  urgency with decreased output.  Has pain with urination, fever, nausea.  She doesnt have money for office visit and is asking if she can come in and leave urine sample, but she cant come in today.  She will call back tomorrow morning if she is not better and will try AZO in the meantime. Initial call taken by: Lowella Petties CMA,  January 02, 2010 9:53 AM  Follow-up for Phone Call        if symptoms worsen- go to UC after hours  otherwise f/u or drop off ua tomorrow Follow-up by: [name] MD,  January 02, 2010 11:00 AM  Additional Follow-up for Phone Call Additional follow up Details #1::        Patient notified as instructed by telephone. Pt said would see how she feels in AM and if needed would call for nurse visit for u/a.[name] LPN  January 03, 5283 11:13 AM

## 2010-08-28 NOTE — Assessment & Plan Note (Signed)
Summary: Phenergan inj 25mg //kad  Nurse Visit  Comments Patient came in for labs and was complaining of nausea,vomitting and diarrhea since her visit with Dr. Sharen Hones 2 days ago. He spoke with patient and advised to give her 25mg  of Phenergan as long as she could get a driver to come get her. She spoke with her parents who came and picked her up so she could receive the injection.    Allergies: 1)  ! Sulfa 2)  ! Erythromycin 3)  ! Imitrex 4)  ! Cipro 5)  ! Percocet 6)  ! * Ivp Dye 7)  ! * Shellfish  Medication Administration  Injection # 1:    Medication: Promethazine up to 50mg     Diagnosis: NAUSEA AND VOMITING (ICD-787.01)    Route: IM    Site: LUOQ gluteus    Exp Date: 07/27/2011    Lot #: [id number]    Mfr: Baxter    Comments: 25mg  per Dr. Sharen Hones for nausea and vomitting.    Patient tolerated injection without complications    Given by: Anna Pena CMA Duncan Dull) (August 08, 2010 12:31 PM)  Orders Added: 1)  Promethazine up to 50mg  [J2550] 2)  Admin of Therapeutic Inj  intramuscular or subcutaneous [96372]  feeling nauseated, vomiting.  has phenergan suppositories but that doesn't help.  fever off and on for months, unable to give specific time frame.  + nausea/vomiting, diarrhea.  no abd pain thought.  + myalgia, arthralgia.  + cough and SOB.  + NS.  + weight gain.  This is a chronic issue that seems to have been going on for several months, has had previous workup unrevealing.  tired of not knowing what is wrong with her.  I wanted to obtain UA/Cx but pt unable to give sample today.  denies dysuria, polyuria or urgency.  had admission in 05/2010 at [facility] for similar sxs as currently, had blood work done there, pt says doesn't remember what this was due to.  had CT abd done.  will request records from [facility] as well.  never had bone marrow biopsy, saw heme/onc for transient leukocytosis that since resolved (09/2008 and 09/2009).    Again advised if deteriorating over weekend, needs  to go to ER.  pt endorses understanding.  Unclear etiology of leukocytosis, have rechecked CBC today as well as trending CRP and CPK and blood cultures.  Pt seems stable enough to continue eval outpatient.  Has been chronic issue that so far seems with elusive diagnosis.  wanted patient to return sooner next week, but she states that cannot until Friday. Anna Boyden  MD  August 08, 2010 11:43 AM   Appended Document: Phenergan inj 25mg //kad pt left before I was finished with her, did not have chance to check vital signs.

## 2010-08-28 NOTE — Letter (Signed)
Summary: Midwife   [employer] Authorization   Imported By: Roderic Ovens 08/28/2009 14:19:00  _____________________________________________________________________  External Attachment:    Type:   Image     Comment:   External Document

## 2010-08-28 NOTE — Progress Notes (Signed)
Summary: Called Pt  Phone Note Outgoing Call Call back at [facility] Phone [phone]   Call placed by: Harlon Flor,  April 03, 2010 9:28 AM Call placed to: Patient Summary of Call: [facility] to schedule lab appt Initial call taken by: Harlon Flor,  April 03, 2010 9:29 AM

## 2010-08-28 NOTE — Progress Notes (Signed)
Summary: RX  Phone Note Refill Request Call back at Home Phone [phone] Message from:  SPOUSE on June 16, 2010 9:45 AM  Pt needs the 2 medications that were given to her from her refilled-Is unsure of the names of them as she is currently medicated at [facility] on [address]  Initial call taken by: Harlon Flor,  June 16, 2010 9:47 AM    Prescriptions: NIASPAN 750 MG CR-TABS (NIACIN (ANTIHYPERLIPIDEMIC)) Take 2 tablets by mouth once a day at bedtime  #60 x 1   Entered by:   Bishop Dublin, CMA   Authorized by:   Dossie Arbour MD   Signed by:   Bishop Dublin, CMA on 06/16/2010   Method used:   Electronically to        [facility] [address] (retail)       [address], [address]       [place], [place]  [address]       Ph: [phone]       Fax: [phone]   RxID:   [id number] AMLODIPINE BESYLATE 5 MG TABS (AMLODIPINE BESYLATE) Take one tablet by mouth daily  #30 x 1   Entered by:   Bishop Dublin, CMA   Authorized by:   Dossie Arbour MD   Signed by:   Bishop Dublin, CMA on 06/16/2010   Method used:   Electronically to        [facility] [address] (retail)       [address], [address]       [place], [place]  [address]       Ph: [phone]       Fax: [phone]   RxID:   [id number] CRESTOR 40 MG TABS (ROSUVASTATIN CALCIUM) Take one tablet by mouth daily.  #30 x 1   Entered by:   Bishop Dublin, CMA   Authorized by:   Dossie Arbour MD   Signed by:   Bishop Dublin, CMA on 06/16/2010   Method used:   Electronically to        [facility] [address] (retail)       [address], [address]       [place], [place]  [address]       Ph: [phone]       Fax: [phone]   RxID:   [id number] METOPROLOL TARTRATE 25 MG TABS (METOPROLOL TARTRATE) 1 by mouth two times a day  #60 x 1   Entered by:   Bishop Dublin, CMA   Authorized by:   Dossie Arbour MD   Signed  by:   Bishop Dublin, CMA on 06/16/2010   Method used:   Electronically to        [facility] [address] (retail)       [address], [address]       [place], [place]  [address]       Ph: [phone]       Fax: [phone]   RxID:   [id number]

## 2010-08-28 NOTE — Progress Notes (Signed)
  Phone Note Outgoing Call   Call placed by: Twalsh Call placed to: Patient Details for Reason: cancel ifob Summary of Call: Ifob never returned, per [facility]. Talked to the patient, she will not be doing it. Test cancelled. Initial call taken by: Mills Koller,  May 15, 2010 3:07 PM  Follow-up for Phone Call        let her know I was doing the stool immunoassay to make sure there was not any GI source of her anemia... so have her call if she changes her mind- thanks Follow-up by: [name] MD,  May 15, 2010 4:41 PM  Additional Follow-up for Phone Call Additional follow up Details #1::        I spoke with the patient again today, I told her I would mail her the ifob kit and she said she would do it. Additional Follow-up by: Mills Koller,  May 16, 2010 2:45 PM

## 2010-08-28 NOTE — Progress Notes (Signed)
Summary: ??pneumonia  Phone Note Call from Patient Call back at Home Phone [phone]   Caller: Patient Call For: [name] MD Summary of Call: Patient had pneumonia, right sided. She just called saying that she is hurting really bad on right side. SOB, wheezing, coughing, low grade fever, very tired, out of breath after walking from living room to kitchen. She say that she hasn't smoked in about 150 days, but feels like she just smoke a whole pack. Feels the way she felt with pneumonia before. I told her to go to ER becuase we do not have any appt. left for today and she shouldn't wait much longer because she has already been feeling this way for 2 days and has gotten worse. Patient siad that she is going to [facility].  Initial call taken by: Melody Comas,  Nov 29, 2009 3:27 PM  Follow-up for Phone Call        thank you - I feel strongly she needs to go to hosp for that  could you please fax [facility] her progress note for visit with me in mid march? -- thanks - that may be helpful for them  Follow-up by: [name] MD,  Nov 29, 2009 3:48 PM  Additional Follow-up for Phone Call Additional follow up Details #1::        Spoke with patient, she was going to take shower and then go to ER. Progress note faxed.  Additional Follow-up by: Melody Comas,  Nov 29, 2009 4:04 PM

## 2010-08-28 NOTE — Progress Notes (Signed)
Summary: pneumonia  Phone Note Call from Patient Call back at Home Phone [phone]   Caller: Patient Call For: [name] MD Summary of Call: Patient wanted to let you know that she does have pneumonia. She went to [place]. She is still feeling really bad, but a ltitle better than she did before.  Initial call taken by: Melody Comas,  February 07, 2010 4:32 PM  Follow-up for Phone Call        thanks for the update- I'm glad she is improving  please send for hosp records if avail Follow-up by: [name] MD,  February 07, 2010 5:17 PM  Additional Follow-up for Phone Call Additional follow up Details #1::        thanks  I got ER report  please have her f/u with me in about a week for a re check - and update me earlier if condition worsens Additional Follow-up by: [name] MD,  February 11, 2010 7:45 PM    Additional Follow-up for Phone Call Additional follow up Details #2::    Patient says that she has good days and bad days. She was told that she could possibly have copd. Patient is having financial issues. She will not be able to come in for another 2 weeks or so. She says that her nerves are really bad and she has 7 people under the roof. She is trying to support all of them, trying not to smoke or drink. She says that when she lays down at night she can't rest becuase her mind runs constantly. Her husband is a Naval architect and she doesn't see him except for the weekend and she lays in bed missing him at night. She is asking if something could be called in  to help her rest at night.  Uses   [facility] [address]. She said that she will call back when able to set up appt.    Follow-up by: Melody Comas,  February 13, 2010 3:43 PM  Additional Follow-up for Phone Call Additional follow up Details #3:: Details for Additional Follow-up Action Taken: that has to come from her psychiatrist   Patient notified. Melody Comas  February 14, 2010 2:42 PM  Additional Follow-up by:  [name] MD,  February 13, 2010 7:17 PM

## 2010-08-28 NOTE — Progress Notes (Signed)
Summary: PROBLEMS  Phone Note Call from Patient Call back at Home Phone [phone]   Caller: SELF Call For: Ilya Ess Summary of Call: PT TAKES ASPIRIN 30 MINUTES EVERY NIGHT BEFORE THE NIASPAN-WHEN SHE WAKES UP THE NEXT MORNING, SHE IS ITCHING AND BURNING-IS THIS NORMAL? Initial call taken by: Harlon Flor,  October 18, 2009 9:40 AM  Follow-up for Phone Call        instructed pt to try taking niaspan with high protein snack (cheese and crackers, yogurt).  Pt will try this over the weekend and will let me know if there is any improvement.  Follow-up by: Charlena Cross, RN, BSN,  October 18, 2009 2:23 PM

## 2010-08-28 NOTE — Progress Notes (Signed)
Summary: RX  Phone Note Refill Request Call back at Home Phone [phone] Message from:  SELF on October 15, 2009 10:40 AM  Refills Requested: Medication #1:  NIASPAN 1000 MG CR-TABS 1 tablet daily at bedtime 30 minutes after aspirin [facility] [address]  Initial call taken by: Harlon Flor,  October 15, 2009 10:41 AM    Prescriptions: NIASPAN 1000 MG CR-TABS (NIACIN (ANTIHYPERLIPIDEMIC)) 1 tablet daily at bedtime 30 minutes after aspirin  #30 x 6   Entered by:   Mercer Pod   Authorized by:   Norva Karvonen, MD   Signed by:   Mercer Pod on 10/15/2009   Method used:   Electronically to        [facility] [address] (retail)       [address], [address]       [place], [place]  [address]       Ph: [phone]       Fax: [phone]   RxID:   [id number]

## 2010-08-28 NOTE — Progress Notes (Signed)
Summary: chest pressure  Phone Note Call from Patient   Caller: Patient Reason for Call: Talk to Nurse Summary of Call: Anna Pena calling with shortness of breath, nausea, diarrhea, chest pressure, indigestion, pain in shoulder blades, some cough still from pnuemonia, and just had two teeth pulled and on amoxicillin.  She has had symptoms for one week. What to do? Initial call taken by: Bishop Dublin, CMA,  March 18, 2010 12:57 PM  Follow-up for Phone Call        Notified patient to take some NTG and if symptoms not relieved, she was instructed to go to the ER. Follow-up by: Bishop Dublin, CMA,  March 18, 2010 12:58 PM  Additional Follow-up for Phone Call Additional follow up Details #1::        signs are concerning for recurrance of her PNA. She should contact Dr. Milinda Antis to see if she might need further evaluation.     Appended Document: chest pressure She has a follow up with PCP regarding a nodule that was seen on a chest x-ray and was told to let PCP know the symptoms she is having in case still has pneumonia.

## 2010-08-28 NOTE — Letter (Signed)
[facility]   Imported By: Lanelle Bal 11/01/2009 08:04:17  _____________________________________________________________________  External Attachment:    Type:   Image     Comment:   External Document

## 2010-08-28 NOTE — Assessment & Plan Note (Signed)
Summary: F/U [facility] /[facility]   Vital Signs:  Patient profile:   45 year old female Height:      65 inches Weight:      192.75 pounds BMI:     32.19 Temp:     98.9 degrees F oral Pulse rate:   80 / minute Pulse rhythm:   regular BP sitting:   118 / 80  (left arm) Cuff size:   large  Vitals Entered By: Linde Gillis CMA Duncan Dull) (October 09, 2009 12:05 PM) CC: Hospital follow up   History of Present Illness: here for hosp follow up  had pneumonia - tx with abx and albuterol  HTN med held for a while  had exac of her dizziness in hosp - rec f/u neuro Dr Marjory Lies --no luck or dx - she said they told her there is nothing they can do   just before that was dx with multi vessel coronary artery disease by cath- tx medically by Dr Excell Seltzer she is doing well with that   quit smoking cold [place] for 100 days -- very proud / but did gain 7 lb which upsets her   has not felt good ever since december  her back hurts so bad every day  something inside her back that hurts --- takes 11/2 hours to make her bed - pain is so severe  specialist said "nothing was wrong" -- saw ortho in [place] - did MRI  is miserable and is exhausted -  stays in bed a lot  she really does not want to be referred again - does not have the money   takes everything in her to get moving   took herself off depression med - affects sex life too bad -- is just on 20 mg prozac - wants to come off of that too  is generally dismotivated  is really really tired -- does not know why  is going to f/u with her psychiatrist   still having tight chest and wheezing -- uses rescue inhaler often  no prod cough but some wheeze  does not like niaspan but it it helping   wants to feel like herself   neuro did not find anything wrong  has given up on working up the dizziness   white count is was still high in the hospital     Allergies: 1)  ! Sulfa 2)  ! Erythromycin 3)  ! Imitrex 4)  ! Cipro 5)  ! Percocet 6)  ! *  Ivp Dye 7)  ! * Shellfish  Past History:  Family History: Last updated: 07/02/2009 Fam Hx of Heart Disease (parent, Grandparent) Family History High cholesterol ( Parent, Grandparent) Family History Hypertension ( Parent, Grandparent, other fam mem) Family History Diabetes 1st degree relative (parent / grandparant) Family History Kidney disease (grandparent) no cancer in family  no depression or anx in family  daughter with pseudotumor cerebri--age 77  mother MI at 47--age 57 now, 4 vessel CABG father has HBP and diabetes--age 40  Social History: Last updated: 07/02/2009 Married Current Smoker Alcohol use-no Regular exercise-no G1P1 husband works night  out of work for 2 years - used to work as dialysis tech   Risk Factors: Alcohol Use: 0 (07/02/2009) Caffeine Use: 5-6 (07/02/2009) Exercise: no (07/02/2009)  Risk Factors: Smoking Status: current (07/02/2009) Packs/Day: 0.5 (07/02/2009)  Past Medical History: CAD - diffuse multivessel, small vessel dz. Heart Murmur- no echo  High Blood pressure readings hx of Uti's Anemia-NOS Depression Hyperlipidemia, likely familial  Migraines Kidney Stones GERD Anxiety menopausal - not on hormones  vit D deficiency hyperlipidemia- triglycerides  chronic back pain chronic dizziness of ? etiol- with 1 ENT and 2 neuro work ups   cardiol- [facility]  neuro  Past Surgical History: (2006) Gall Bladder (2005) Breast bx (1989) Tonsillectomy (2000) Hysterectomy) total - for adhesions / endometriosis  2010 multivessel CAD- cath/ tx medically  Review of Systems General:  Complains of fatigue; denies chills, fever, loss of appetite, and malaise. Eyes:  Denies blurring, double vision, and eye irritation. CV:  Denies chest pain or discomfort, palpitations, shortness of breath with exertion, and swelling of feet. Resp:  Complains of shortness of breath and wheezing; denies cough, pleuritic, and sputum productive. GI:  Denies  abdominal pain, bloody stools, change in bowel habits, and nausea. GU:  Denies dysuria and hematuria. MS:  Complains of low back pain and stiffness; denies joint redness, joint swelling, cramps, and muscle weakness. Derm:  Denies itching, lesion(s), poor wound healing, and rash. Neuro:  Complains of tingling; denies numbness; neuropathy is about the same . Psych:  Complains of anxiety; denies panic attacks, sense of great danger, and suicidal thoughts/plans; denies depression but is very tired and non motivated . Endo:  Denies cold intolerance, excessive thirst, excessive urination, and heat intolerance. Heme:  Denies abnormal bruising and bleeding.  Physical Exam  General:  fatigued and down appearing today  Head:  normocephalic, atraumatic, and no abnormalities observed.   Eyes:  vision grossly intact, pupils equal, pupils round, and pupils reactive to light.  no conjunctival pallor, injection or icterus  Mouth:  pharynx pink and moist.   Neck:  supple with full rom and no masses or thyromegally, no JVD or carotid bruit  Lungs:  diffusely distant bs with harsh bs at bases wheeze on forced exp only  slt prolonged exp phase  not sob Heart:  Normal rate and regular rhythm. S1 and S2 normal without gallop, murmur, click, rub or other extra sounds. Abdomen:  Bowel sounds positive,abdomen soft and non-tender without masses, organomegaly or hernias noted. no renal bruits  Msk:  no acute joint changes LS and musculature surrounding it is tender   Pulses:  R and L carotid,radial,femoral,dorsalis pedis and posterior tibial pulses are full and equal bilaterally Extremities:  No clubbing, cyanosis, edema, or deformity noted with normal full range of motion of all joints.   Neurologic:  sensation intact to light touch, gait normal, and DTRs symmetrical and normal.  (gait is slow due to back pain) Skin:  Intact without suspicious lesions or rashes Cervical Nodes:  No lymphadenopathy noted Inguinal  Nodes:  No significant adenopathy Psych:  seems down today- tearful at times speaks easily about stress and symptoms  good eye contact and comm skills no SI   Impression & Recommendations:  Problem # 1:  CAD, NATIVE VESSEL (ICD-414.01) Assessment Improved  medically managed by Dr Excell Seltzer- overall pt is stable dislikes the side eff of niaspan but thinks it is helping her cholesterol so wants to continue  commended on smoking cessation Her updated medication list for this problem includes:    Metoprolol Tartrate 25 Mg Tabs (Metoprolol tartrate) .Marland Kitchen... 1 by mouth two times a day    Amlodipine Besylate 5 Mg Tabs (Amlodipine besylate) .Marland Kitchen... Take one tablet by mouth daily    Aspirin 81 Mg Tabs (Aspirin) .Marland Kitchen... Take two tablets by mouth at bedtime    Nitrostat 0.4 Mg Subl (Nitroglycerin) ..... Use as needed  Orders: Prescription Created Electronically (  [email or web address])  Problem # 2:  DIZZINESS (ICD-780.4) Assessment: Unchanged  this is chronic and she desires no further work up  has seen 2 neurol and ENT - tells me she will deal with it  I adv her to update me if worse or other symptoms  Orders: Prescription Created Electronically [phone])  Problem # 3:  BACK PAIN (ICD-724.5) Assessment: Deteriorated  low back pain - increasing in severity with time - neg ortho workups with nl mri and films  was offered pain clinic at one point- not interested  may consider sport med visit in future-will let me know  Her updated medication list for this problem includes:    Vicodin 5-500 Mg Tabs (Hydrocodone-acetaminophen) .Marland Kitchen... 1 by mouth up to every 6 hours as needed pain    Aspirin 81 Mg Tabs (Aspirin) .Marland Kitchen... Take two tablets by mouth at bedtime  Orders: Prescription Created Electronically [phone])  Problem # 4:  LEUKOCYTOSIS (ICD-288.60) Assessment: Unchanged  this is still an issue and I do feel she should pursue the heme eval/bone marrow bx this could also be contributing to her fatigue she will  call herself to arrange f/u and test   Orders: Prescription Created Electronically [phone])  Problem # 5:  DEPRESSION (ICD-311) Assessment: Comment Only  per pt - none of the meds have been helpful enough to warrant the change in sex drive - so she is gradually tapering off all meds with help of her psychiatrist  she is aware that some of her fatigue and dismotivation could be depression related even though she does not feel "depressed" The following medications were removed from the medication list:    Wellbutrin Xl 300 Mg Xr24h-tab (Bupropion hcl) .Marland Kitchen... Take 1 tablet by mouth once a day Her updated medication list for this problem includes:    Prozac 20 Mg Caps (Fluoxetine hcl) .Marland Kitchen... Take 3 tablet by mouth once a day    Valium 5 Mg Tabs (Diazepam) .Marland Kitchen... Take one by mouth three times a day as needed  Orders: Prescription Created Electronically [phone])  Problem # 6:  PNEUMONIA, RIGHT (ICD-486) Assessment: Improved  reviewed hosp records today- d/c summary she is clinically resolved but still suffering from wheezing intermittent and has to use inhaler suspect some component of copd - though commended on smoking cessation  given samples and px for spiriva to try and will update me  if return of cough or fever will update plan to f/u 6 mo (earlier if needed) as finances are limited   Orders: Prescription Created Electronically [phone])  Problem # 7:  TOBACCO USE, QUIT (ICD-V15.82) Assessment: Comment Only  commended on this strongly- day 100 of being quit  has had some wt gain that distresses her - disc some options to keep busy so she does not eat to replace smoking  Orders: Prescription Created Electronically [phone])  Complete Medication List: 1)  Metoprolol Tartrate 25 Mg Tabs (Metoprolol tartrate) .Marland Kitchen.. 1 by mouth two times a day 2)  Neurontin 600 Mg Tabs (Gabapentin) .Marland Kitchen.. 1 by mouth three times a day 3)  Prozac 20 Mg Caps (Fluoxetine hcl) .... Take 3 tablet by mouth once a  day 4)  Valium 5 Mg Tabs (Diazepam) .... Take one by mouth three times a day as needed 5)  Proair Hfa 108 (90 Base) Mcg/act Aers (Albuterol sulfate) .... 2 puffs up to every 4 hours as needed for wheezing 6)  Crestor 40 Mg Tabs (Rosuvastatin calcium) .... Take one tablet by mouth daily. 7)  Vicodin  5-500 Mg Tabs (Hydrocodone-acetaminophen) .Marland Kitchen.. 1 by mouth up to every 6 hours as needed pain 8)  Fish Oil Oil (Fish oil) .... 2 two times a day 9)  Tums 500 Mg Chew (Calcium carbonate antacid) .... As needed 10)  Amlodipine Besylate 5 Mg Tabs (Amlodipine besylate) .... Take one tablet by mouth daily 11)  Niaspan 1000 Mg Cr-tabs (Niacin (antihyperlipidemic)) .Marland Kitchen.. 1 tablet daily at bedtime 30 minutes after aspirin 12)  Aspirin 81 Mg Tabs (Aspirin) .... Take two tablets by mouth at bedtime 13)  Nitrostat 0.4 Mg Subl (Nitroglycerin) .... Use as needed 14)  Transdermal Patch 2" X 3" Ptch (Transdermal patch) .... Use as needed 15)  Spiriva Handihaler 18 Mcg Caps (Tiotropium bromide monohydrate) .Marland Kitchen.. 1 inhalation once daily  Patient Instructions: 1)  use spiriva once daily as directed  2)  use the rescue inhaler as needed  3)  let me know if not improved  4)  call your hematologist to re schedule your bone marrow biopsy  5)  let me know if you want to see Dr Patsy Lager (our sports med doctor) in future for your back pain  6)  follow up with me in about 6 months  Prescriptions: AMLODIPINE BESYLATE 5 MG TABS (AMLODIPINE BESYLATE) Take one tablet by mouth daily  #30 x 11   Entered and Authorized by:   [name] MD   Signed by:   [name] MD on 10/09/2009   Method used:   Electronically to        [facility]  [address] (retail)       [address], [address]       [place], [place]  [address]       Ph: [phone]       Fax: [phone]   RxID:   [id number] SPIRIVA HANDIHALER 18 MCG CAPS (TIOTROPIUM BROMIDE MONOHYDRATE) 1 inhalation once daily  #1 mdi x 11    Entered and Authorized by:   [name] MD   Signed by:   [name] MD on 10/09/2009   Method used:   Print then Give to Patient   RxID:   [id number]   Current Allergies (reviewed today): ! SULFA ! ERYTHROMYCIN ! IMITREX ! CIPRO ! PERCOCET ! * IVP DYE ! * SHELLFISH   Influenza Immunization History:    Influenza # 1:  Fluvax 3+ (07/02/2009)  Pneumovax Immunization History:    Pneumovax # 1:  Pneumovax (07/02/2009)

## 2010-08-28 NOTE — Progress Notes (Signed)
Summary: ?Bone Marrow test  Phone Note Call from Patient Call back at [phone]   Caller: Patient Call For: [name] MD Summary of Call: Pt picked up Gabapentin 600mg  over the weekend from [facility] on [address] When pt got home realized only  #30 pills in bottle with instruction take one daily. She took new rx Dr Milinda Antis gave her to [facility] on 07/01/09 which read #90 take one three times a day. I called [facility] [address] and Barbara Cower the pharmacist said to bring that bottle back in it was from an old rx # and he would give #90. Pt then wanted me to ask Dr. Milinda Antis if she wants her to still go for bone marrow test. Pt had to cancel appt with Dr. Milinda Antis on 08/08/09. Pt said she would rechedule appt with Dr. Milinda Antis after she has appt with neurologist on 09/02/09 and appt with heart dr on 09/09/09. Please advise.  Initial call taken by: [name] LPN,  August 12, 2009 3:53 PM  Follow-up for Phone Call        I do think she needs to proceed with bone marrow bx as recommended by her hematologist when she is ready and stable enough to do so  can disc in more detail when she does follow up  Follow-up by: [name] MD,  August 12, 2009 5:18 PM  Additional Follow-up for Phone Call Additional follow up Details #1::        Advised pt. Additional Follow-up by: Lowella Petties CMA,  August 12, 2009 5:31 PM

## 2010-09-01 ENCOUNTER — Ambulatory Visit: Payer: Self-pay | Admitting: Family Medicine

## 2010-09-02 ENCOUNTER — Other Ambulatory Visit: Payer: Self-pay | Admitting: Oncology

## 2010-09-02 ENCOUNTER — Telehealth: Payer: Self-pay | Admitting: Family Medicine

## 2010-09-02 ENCOUNTER — Encounter (HOSPITAL_BASED_OUTPATIENT_CLINIC_OR_DEPARTMENT_OTHER): Payer: Managed Care, Other (non HMO) | Admitting: Oncology

## 2010-09-02 ENCOUNTER — Encounter: Payer: Self-pay | Admitting: Family Medicine

## 2010-09-02 DIAGNOSIS — D72829 Elevated white blood cell count, unspecified: Secondary | ICD-10-CM

## 2010-09-02 DIAGNOSIS — D473 Essential (hemorrhagic) thrombocythemia: Secondary | ICD-10-CM

## 2010-09-02 DIAGNOSIS — F17200 Nicotine dependence, unspecified, uncomplicated: Secondary | ICD-10-CM

## 2010-09-02 LAB — CBC WITH DIFFERENTIAL/PLATELET
BASO%: 0.4 % (ref 0.0–2.0)
Basophils Absolute: 0 10*3/uL (ref 0.0–0.1)
EOS%: 2.1 % (ref 0.0–7.0)
MCH: 28.4 pg (ref 25.1–34.0)
MCHC: 33.5 g/dL (ref 31.5–36.0)
MCV: 84.6 fL (ref 79.5–101.0)
MONO%: 7.2 % (ref 0.0–14.0)
NEUT%: 53.8 % (ref 38.4–76.8)
RDW: 15.3 % — ABNORMAL HIGH (ref 11.2–14.5)
lymph#: 3.5 10*3/uL — ABNORMAL HIGH (ref 0.9–3.3)

## 2010-09-03 LAB — COMPREHENSIVE METABOLIC PANEL
AST: 12 U/L (ref 0–37)
Albumin: 4 g/dL (ref 3.5–5.2)
Alkaline Phosphatase: 123 U/L — ABNORMAL HIGH (ref 39–117)
BUN: 12 mg/dL (ref 6–23)
Calcium: 9.4 mg/dL (ref 8.4–10.5)
Chloride: 94 mEq/L — ABNORMAL LOW (ref 96–112)
Potassium: 4.1 mEq/L (ref 3.5–5.3)
Sodium: 134 mEq/L — ABNORMAL LOW (ref 135–145)
Total Protein: 7.5 g/dL (ref 6.0–8.3)

## 2010-09-03 LAB — LACTATE DEHYDROGENASE: LDH: 140 U/L (ref 94–250)

## 2010-09-04 LAB — PROTEIN ELECTROPHORESIS, SERUM
Albumin ELP: 53.3 % — ABNORMAL LOW (ref 55.8–66.1)
Alpha-1-Globulin: 4.2 % (ref 2.9–4.9)
Beta 2: 6.3 % (ref 3.2–6.5)
Beta Globulin: 6.9 % (ref 4.7–7.2)

## 2010-09-04 LAB — IRON AND TIBC
%SAT: 8 % — ABNORMAL LOW (ref 20–55)
TIBC: 403 ug/dL (ref 250–470)

## 2010-09-04 LAB — FOLATE: Folate: 8.7 ng/mL

## 2010-09-04 LAB — VITAMIN B12: Vitamin B-12: 419 pg/mL (ref 211–911)

## 2010-09-10 ENCOUNTER — Encounter: Payer: Self-pay | Admitting: Family Medicine

## 2010-09-10 ENCOUNTER — Other Ambulatory Visit: Payer: Self-pay | Admitting: Interventional Radiology

## 2010-09-10 ENCOUNTER — Ambulatory Visit (HOSPITAL_COMMUNITY)
Admission: RE | Admit: 2010-09-10 | Discharge: 2010-09-10 | Disposition: A | Discharge status: Home / Self Care | Payer: Managed Care, Other (non HMO) | Source: Ambulatory Visit | Attending: Oncology | Admitting: Oncology

## 2010-09-10 ENCOUNTER — Ambulatory Visit (HOSPITAL_COMMUNITY): Payer: Managed Care, Other (non HMO)

## 2010-09-10 ENCOUNTER — Other Ambulatory Visit: Payer: Self-pay

## 2010-09-10 DIAGNOSIS — D72829 Elevated white blood cell count, unspecified: Secondary | ICD-10-CM | POA: Insufficient documentation

## 2010-09-10 DIAGNOSIS — Z01812 Encounter for preprocedural laboratory examination: Secondary | ICD-10-CM | POA: Insufficient documentation

## 2010-09-10 LAB — CBC
HCT: 33.8 % — ABNORMAL LOW (ref 36.0–46.0)
Hemoglobin: 11 g/dL — ABNORMAL LOW (ref 12.0–15.0)
MCH: 27.6 pg (ref 26.0–34.0)
MCHC: 32.5 g/dL (ref 30.0–36.0)
MCV: 84.7 fL (ref 78.0–100.0)
Platelets: 399 K/uL (ref 150–400)
RBC: 3.99 MIL/uL (ref 3.87–5.11)
RDW: 15.2 % (ref 11.5–15.5)
WBC: 10.9 K/uL — ABNORMAL HIGH (ref 4.0–10.5)

## 2010-09-10 LAB — PROTIME-INR: INR: 0.98 (ref 0.00–1.49)

## 2010-09-10 LAB — GLUCOSE, CAPILLARY: Glucose-Capillary: 326 mg/dL — ABNORMAL HIGH (ref 70–99)

## 2010-09-10 LAB — APTT: aPTT: 29 seconds (ref 24–37)

## 2010-09-11 NOTE — Progress Notes (Signed)
  Phone Note From Other Clinic   Summary of Call: call from hematology Dr Park Breed she has seen pt once before for reactive leukocytosis - she was sent back after high wbc 22 at cardiology office recently Dr Park Breed states her ct is back down to 9.5 today with hb of 11.2-- much imp she plans to do iron / anemia w/u on her  her inclination is that her leukocytosis is intermittent and purely reactive to stressors (is always severely stressed) and recurrent infections however- given the pt's worry about the matter -- has decided to go foward with bone marrow bx just to be sure and get closure on the issue she plans to schedule this/ disc further with the patient and let me know results this is just FYI  Follow-up for Phone Call        I told her I appreciated the update on this medically and psychologically complex patient- and would look foward to results  Follow-up by: [name] MD,  September 02, 2010 11:42 AM

## 2010-09-15 ENCOUNTER — Ambulatory Visit (INDEPENDENT_AMBULATORY_CARE_PROVIDER_SITE_OTHER): Payer: Managed Care, Other (non HMO) | Admitting: Family Medicine

## 2010-09-15 ENCOUNTER — Encounter: Payer: Self-pay | Admitting: Family Medicine

## 2010-09-15 DIAGNOSIS — I10 Essential (primary) hypertension: Secondary | ICD-10-CM

## 2010-09-15 DIAGNOSIS — E119 Type 2 diabetes mellitus without complications: Secondary | ICD-10-CM

## 2010-09-15 DIAGNOSIS — F411 Generalized anxiety disorder: Secondary | ICD-10-CM

## 2010-09-15 LAB — HM DIABETES FOOT EXAM

## 2010-09-19 ENCOUNTER — Encounter (HOSPITAL_BASED_OUTPATIENT_CLINIC_OR_DEPARTMENT_OTHER): Payer: Managed Care, Other (non HMO) | Admitting: Oncology

## 2010-09-19 DIAGNOSIS — D649 Anemia, unspecified: Secondary | ICD-10-CM

## 2010-09-22 ENCOUNTER — Encounter: Payer: Self-pay | Admitting: Family Medicine

## 2010-09-23 NOTE — Assessment & Plan Note (Signed)
Summary: CHECK SUGAR/CLE  [employer]   Vital Signs:  Patient profile:   45 year old female Weight:      207 pounds BMI:     34.57 Temp:     98.0 degrees F oral Pulse rate:   88 / minute Pulse rhythm:   regular BP sitting:   120 / 70  (left arm) Cuff size:   large  Vitals Entered By: Sydell Axon LPN (September 15, 2010 11:51 AM) CC: Follow-up on sugar, BS has been elevated    History of Present Illness: has gained wt  is upset about this  still stays in her room a lot  fights with her daughter and her husband   dislikes veg but lettuce  drinks some sugar drinks  trying some gingerale -will try      just had her bone marrow bx recently  was told her iron was very low -- and is getting iron infusion   is is in a severely stressful situation  -- 7 people live with her for 4-5 more mo likes her new psychiatrist - more communicative   still a non smoker   DM in the family   sugars at home are quite high 300s-400s    Allergies: 1)  ! Sulfa 2)  ! Erythromycin 3)  ! Imitrex 4)  ! Cipro 5)  ! Percocet 6)  ! * Ivp Dye 7)  ! * Shellfish  Past History:  Past Surgical History: Last updated: 10/09/2009 (2006) Gall Bladder (2005) Breast bx (1989) Tonsillectomy (2000) Hysterectomy) total - for adhesions / endometriosis  2010 multivessel CAD- cath/ tx medically  Family History: Last updated: 07/02/2009 Fam Hx of Heart Disease (parent, Grandparent) Family History High cholesterol ( Parent, Grandparent) Family History Hypertension ( Parent, Grandparent, other fam mem) Family History Diabetes 1st degree relative (parent / grandparant) Family History Kidney disease (grandparent) no cancer in family  no depression or anx in family  daughter with pseudotumor cerebri--age 38  mother MI at 47--age 23 now, 4 vessel CABG father has HBP and diabetes--age 67  Social History: Last updated: 08/06/2010 Married quit smoking 06/2009 Alcohol use-no Regular  exercise-no G1P1 husband truck driver and works night  out of work for 2 years - used to work as dialysis tech   Risk Factors: Alcohol Use: 0 (07/02/2009) Caffeine Use: 5-6 (07/02/2009) Exercise: no (07/02/2009)  Risk Factors: Smoking Status: quit > 6 months (01/20/2010) Packs/Day: 1.0+ (01/20/2010)  Past Medical History: CAD - diffuse multivessel, small vessel dz. Pneumonia, hospital acquired, 2011 Heart Murmur- no echo  High Blood pressure readings hx of Uti's Anemia - with IV iron tx  Depression Hyperlipidemia, likely familial Migraines Kidney Stones GERD Anxiety menopausal - not on hormones  vit D deficiency hyperlipidemia- triglycerides  chronic back pain chronic dizziness of ? etiol- with 1 ENT and 2 neuro work ups   cardiol- Cooper  neuro heme - Kahn  Review of Systems General:  Complains of fatigue and malaise; denies fever and loss of appetite. Eyes:  Complains of double vision; denies blurring and eye irritation. CV:  Denies chest pain or discomfort, lightheadness, palpitations, and shortness of breath with exertion. Resp:  Denies cough, shortness of breath, and wheezing. GI:  Denies abdominal pain, change in bowel habits, indigestion, and nausea. GU:  Denies dysuria and urinary frequency. MS:  Complains of joint pain and stiffness; denies joint redness, joint swelling, cramps, and muscle weakness. Derm:  Denies itching, lesion(s), poor wound healing, and rash. Neuro:  Denies falling  down, headaches, numbness, and tingling. Psych:  Complains of anxiety, depression, easily tearful, and irritability; denies panic attacks, sense of great danger, and suicidal thoughts/plans. Endo:  Denies cold intolerance, excessive thirst, excessive urination, and heat intolerance. Heme:  Denies abnormal bruising and bleeding.  Physical Exam  General:  overwt and frustrated/ fatigued appearing  Head:  normocephalic, atraumatic, and no abnormalities observed.   Eyes:   vision grossly intact, pupils equal, pupils round, and pupils reactive to no conjunctival pallor, injection or icterus  Mouth:  pharynx pink and moist.   Neck:  supple with full rom and no masses or thyromegally, no JVD or carotid bruit  Chest Wall:  No deformities, masses, or tenderness noted. Lungs:  diffusely distant bs with good air exch  no rales or wheeze  Heart:  Normal rate and regular rhythm. S1 and S2 normal without gallop, murmur, click, rub or other extra sounds. Abdomen:  Bowel sounds positive,abdomen soft and non-tender without masses, organomegaly or hernias noted. no renal bruits  Msk:  no acute joint changes Pulses:  2+ rad pulses Extremities:  no pedal edema bilaterally Neurologic:  sensation intact to light touch, gait normal, and DTRs symmetrical and normal.  (gait is slow due to back pain) Skin:  Intact without suspicious lesions or rashes Cervical Nodes:  No lymphadenopathy noted Inguinal Nodes:  No significant adenopathy Psych:  negative attitude- bitter and complaining  frequently interrupts - almost impossible to communicate with  though eye contact is ok  some emotional friction with husband who is with her   Diabetes Management Exam:    Foot Exam (with socks and/or shoes not present):       Sensory-Pinprick/Light touch:          Left medial foot (L-4): normal          Left dorsal foot (L-5): normal          Left lateral foot (S-1): normal          Right medial foot (L-4): normal          Right dorsal foot (L-5): normal          Right lateral foot (S-1): normal       Sensory-Monofilament:          Left foot: normal          Right foot: normal       Inspection:          Left foot: normal          Right foot: normal       Nails:          Left foot: normal          Right foot: normal   Impression & Recommendations:  Problem # 1:  DIABETES-TYPE 2 (ICD-250.00) Assessment Deteriorated  with sugars quite high  cannot afford DM teaching  neg attitude  from stress  handouts given from [facility] starting metformin 500 two times a day - will update with sugar log two times a day ) for 2 weeks at that time will likely adv dose to 1000 mg two times a day  then f/u 3 wk fair to predict she may need more med  Her updated medication list for this problem includes:    Aspirin 81 Mg Tabs (Aspirin) .Marland Kitchen... Take two tablets by mouth at bedtime    Glucophage 500 Mg Tabs (Metformin hcl) .Marland Kitchen... 1 by mouth two times a day  Orders: Prescription Created Electronically [phone])  Problem #  2:  HYPERTENSION, BENIGN (ICD-401.1) Assessment: Unchanged good control currently  no changes  will need to disc ace for renal prot in future  Her updated medication list for this problem includes:    Metoprolol Tartrate 25 Mg Tabs (Metoprolol tartrate) .Marland Kitchen... 1 by mouth two times a day    Amlodipine Besylate 5 Mg Tabs (Amlodipine besylate) .Marland Kitchen... Take one tablet by mouth daily  BP today: 120/70 Prior BP: 126/76 (08/14/2010)  Labs Reviewed: K+: 3.5 (08/06/2010) Creat: : 1.1 (08/06/2010)   Chol: 173 (04/15/2010)   HDL: 47 (04/15/2010)   LDL: 78 (04/15/2010)   TG: 238 (04/15/2010)  Problem # 3:  ANXIETY (ICD-300.00) Assessment: Deteriorated with depression continues to see psychiatrist  seems to have taken a downturn with recent situational stress much friction between herself and husband in room  pt puts up barriers to every suggestion either of [place] make -- and makes a point to blame other family members for her problems overall very negative attitude with complete unwillingness to change affect is quite immature/ childlike today - ? if possible she has personality disorder very difficult to communicate with at this point  enc to continue mental health f/u doubt we will make progress with her medical problems without imp in this relm spent 25 minutes face to face time with pt , over 50% of which was spent on counseling and coordination of care  -- made best effort I  could  Her updated medication list for this problem includes:    Valium 5 Mg Tabs (Diazepam) .Marland Kitchen... Take 4 at night    Cymbalta 60 Mg Cpep (Duloxetine hcl) ..... 30mg  by mouth three times a day  Complete Medication List: 1)  Metoprolol Tartrate 25 Mg Tabs (Metoprolol tartrate) .Marland Kitchen.. 1 by mouth two times a day 2)  Neurontin 600 Mg Tabs (Gabapentin) .... 2 by mouth three times a day 3)  Valium 5 Mg Tabs (Diazepam) .... Take 4 at night 4)  Cymbalta 60 Mg Cpep (Duloxetine hcl) .... 30mg  by mouth three times a day 5)  Proair Hfa 108 (90 Base) Mcg/act Aers (Albuterol sulfate) .... 2 puffs up to every 4 hours as needed for wheezing 6)  Crestor 40 Mg Tabs (Rosuvastatin calcium) .... Take one tablet by mouth daily. 7)  Tums 500 Mg Chew (Calcium carbonate antacid) .... As needed 8)  Amlodipine Besylate 5 Mg Tabs (Amlodipine besylate) .... Take one tablet by mouth daily 9)  Aspirin 81 Mg Tabs (Aspirin) .... Take two tablets by mouth at bedtime 10)  Nitrostat 0.4 Mg Subl (Nitroglycerin) .... Use as needed 11)  Spiriva Handihaler 18 Mcg Caps (Tiotropium bromide monohydrate) .Marland Kitchen.. 1 inhalation once daily 12)  Niaspan 750 Mg Cr-tabs (Niacin (antihyperlipidemic)) .... Take 2 tablets by mouth once a day at bedtime 13)  Zegerid 40-1100 Mg Caps (Omeprazole-sodium bicarbonate) .Marland Kitchen.. 1 by mouth once daily 14)  Vitamin D (ergocalciferol) 50000 Unit Caps (Ergocalciferol) .... Take one weekly x 8 wks 15)  Fish Oil 1000 Mg Caps (Omega-3 fatty acids) .... Take one two times a day 16)  Glucophage 500 Mg Tabs (Metformin hcl) .Marland Kitchen.. 1 by mouth two times a day  Patient Instructions: 1)  start metformin 500 mg two times a day  2)  if any side effects or problems let me know  3)  in 2 weeks send me a blood sugar log -- am fasting and 2 hours after a meal check sugar  4)  then we will likely increase dose  5)  schedule  follow up with me in about 3 months  6)  try your best to follow diabetic diet and work on exercise   Prescriptions: PROAIR HFA 108 (90 BASE) MCG/ACT AERS (ALBUTEROL SULFATE) 2 puffs up to every 4 hours as needed for wheezing  #1 mdi x 11   Entered and Authorized by:   [name] MD   Signed by:   [name] MD on 09/15/2010   Method used:   Electronically to        [facility] [address] (retail)       [address], [address]       [place], [place]  [address]       Ph: [phone]       Fax: [phone]   RxID:   [id number] GLUCOPHAGE 500 MG TABS (METFORMIN HCL) 1 by mouth two times a day  #60 x 5   Entered and Authorized by:   [name] MD   Signed by:   [name] MD on 09/15/2010   Method used:   Electronically to        [facility] [address] (retail)       [address], [address]       [place], [place]  [address]       Ph: [phone]       Fax: [phone]   RxID:   [id number]    Orders Added: 1)  Prescription Created Electronically [G8553] 2)  Est. Patient Level IV [88416]    Current Allergies (reviewed today): ! SULFA ! ERYTHROMYCIN ! IMITREX ! CIPRO ! PERCOCET ! * IVP DYE ! * SHELLFISH

## 2010-09-23 NOTE — Letter (Signed)
Summary: [facility]  [facility]   Imported By: Lanelle Bal 09/18/2010 14:30:00  _____________________________________________________________________  External Attachment:    Type:   Image     Comment:   External Document

## 2010-09-26 ENCOUNTER — Encounter (HOSPITAL_BASED_OUTPATIENT_CLINIC_OR_DEPARTMENT_OTHER): Payer: Managed Care, Other (non HMO) | Admitting: Oncology

## 2010-09-26 DIAGNOSIS — D649 Anemia, unspecified: Secondary | ICD-10-CM

## 2010-10-06 ENCOUNTER — Encounter (HOSPITAL_BASED_OUTPATIENT_CLINIC_OR_DEPARTMENT_OTHER): Payer: Managed Care, Other (non HMO) | Admitting: Oncology

## 2010-10-06 ENCOUNTER — Other Ambulatory Visit: Payer: Self-pay | Admitting: Oncology

## 2010-10-06 DIAGNOSIS — D72829 Elevated white blood cell count, unspecified: Secondary | ICD-10-CM

## 2010-10-06 DIAGNOSIS — D473 Essential (hemorrhagic) thrombocythemia: Secondary | ICD-10-CM

## 2010-10-06 DIAGNOSIS — F17200 Nicotine dependence, unspecified, uncomplicated: Secondary | ICD-10-CM

## 2010-10-06 LAB — CBC WITH DIFFERENTIAL/PLATELET
BASO%: 0.4 % (ref 0.0–2.0)
EOS%: 0.8 % (ref 0.0–7.0)
HCT: 35.3 % (ref 34.8–46.6)
LYMPH%: 39 % (ref 14.0–49.7)
MCH: 29.3 pg (ref 25.1–34.0)
MCHC: 33.6 g/dL (ref 31.5–36.0)
NEUT%: 52.8 % (ref 38.4–76.8)
Platelets: 363 10*3/uL (ref 145–400)

## 2010-10-13 ENCOUNTER — Telehealth: Payer: Self-pay | Admitting: Family Medicine

## 2010-10-14 LAB — POCT CARDIAC MARKERS
CKMB, poc: <1 ng/mL — ABNORMAL LOW (ref 1.0–8.0)
Myoglobin, poc: 50.3 ng/mL (ref 12–200)

## 2010-10-14 LAB — BASIC METABOLIC PANEL
BUN: 6 mg/dL (ref 6–23)
Calcium: 9.1 mg/dL (ref 8.4–10.5)
Creatinine, Ser: 0.98 mg/dL (ref 0.4–1.2)
GFR calc non Af Amer: >60 mL/min (ref >60)
Glucose, Bld: 159 mg/dL — ABNORMAL HIGH (ref 70–99)

## 2010-10-14 LAB — CBC
Platelets: 401 10*3/uL — ABNORMAL HIGH (ref 150–400)
RDW: 13.5 % (ref 11.5–15.5)

## 2010-10-14 LAB — DIFFERENTIAL
Basophils Absolute: 0 10*3/uL (ref 0.0–0.1)
Eosinophils Relative: 2 % (ref 0–5)
Lymphocytes Relative: 25 % (ref 12–46)
Neutro Abs: 8.2 10*3/uL — ABNORMAL HIGH (ref 1.7–7.7)

## 2010-10-14 LAB — D-DIMER, QUANTITATIVE: D-Dimer, Quant: 0.33 ug/mL-FEU (ref 0.00–0.48)

## 2010-10-14 NOTE — Letter (Addendum)
Summary: Pt's Blood Sugar Readings from 09/22/10-10/05/10  Pt's Blood Sugar Readings from 09/22/10-10/05/10   Imported By: Beau Fanny 10/07/2010 09:52:34  _____________________________________________________________________  External Attachment:    Type:   Image     Comment:   External Document  Appended Document: Pt's Blood Sugar Readings from 09/22/10-10/05/10 please ask pt to inc metformin to 1000 mg two times a day and update with sugar report again in about 2 weeks thanks px written on EMR for call in - metformin  Patient notified as instructed by telephone. Medication phoned to [facility] [address] pharmacy as instructed. D/C Metformin 500mg  rx.Rena [facility] LPN  October 13, 2010 10:36 AM   Clinical Lists Changes  Medications: Changed medication from GLUCOPHAGE 500 MG TABS (METFORMIN HCL) 1 by mouth two times a day to METFORMIN HCL 1000 MG TABS (METFORMIN HCL) 1 by mouth two times a day - Signed Rx of METFORMIN HCL 1000 MG TABS (METFORMIN HCL) 1 by mouth two times a day;  #60 x 11;  Signed;  Entered by: [name] MD;  Authorized by: [name] MD;  Method used: Telephoned to [facility] [address], [place], [place], [place]  [address], Ph: [phone], Fax: [phone]    Prescriptions: METFORMIN HCL 1000 MG TABS (METFORMIN HCL) 1 by mouth two times a day  #60 x 11   Entered and Authorized by:   [name] MD   Signed by:   [name] LPN on 13/02/6577   Method used:   Telephoned to ...       [facility] [address] (retail)       [address], [address]       [place], [place]  [address]       Ph: [phone]       Fax: [phone]   RxID:   [id number]

## 2010-10-16 LAB — BASIC METABOLIC PANEL
BUN: 7 mg/dL (ref 6–23)
CO2: 24 mEq/L (ref 19–32)
Calcium: 8.2 mg/dL — ABNORMAL LOW (ref 8.4–10.5)
Creatinine, Ser: 0.89 mg/dL (ref 0.4–1.2)
GFR calc Af Amer: >60 mL/min (ref >60)

## 2010-10-16 LAB — DIFFERENTIAL
Basophils Absolute: 0.1 10*3/uL (ref 0.0–0.1)
Basophils Relative: 0 % (ref 0–1)
Eosinophils Absolute: 0.1 10*3/uL (ref 0.0–0.7)
Lymphocytes Relative: 19 % (ref 12–46)
Lymphocytes Relative: 5 % — ABNORMAL LOW (ref 12–46)
Lymphs Abs: 0.8 10*3/uL (ref 0.7–4.0)
Monocytes Absolute: 0.9 10*3/uL (ref 0.1–1.0)
Monocytes Relative: 5 % (ref 3–12)
Neutro Abs: 12.9 10*3/uL — ABNORMAL HIGH (ref 1.7–7.7)
Neutro Abs: 13.9 10*3/uL — ABNORMAL HIGH (ref 1.7–7.7)
Neutrophils Relative %: 75 % (ref 43–77)
Neutrophils Relative %: 89 % — ABNORMAL HIGH (ref 43–77)

## 2010-10-16 LAB — CULTURE, BLOOD (ROUTINE X 2): Culture: NO GROWTH

## 2010-10-16 LAB — URINALYSIS, ROUTINE W REFLEX MICROSCOPIC
Glucose, UA: NEGATIVE mg/dL
Nitrite: NEGATIVE
Specific Gravity, Urine: 1.022 (ref 1.005–1.030)
pH: 6 (ref 5.0–8.0)

## 2010-10-16 LAB — STREP PNEUMONIAE ANTIBODY SEROTYPES

## 2010-10-16 LAB — COMPREHENSIVE METABOLIC PANEL
ALT: 24 U/L (ref 0–35)
Calcium: 9 mg/dL (ref 8.4–10.5)
Creatinine, Ser: 1.09 mg/dL (ref 0.4–1.2)
Glucose, Bld: 162 mg/dL — ABNORMAL HIGH (ref 70–99)
Sodium: 134 mEq/L — ABNORMAL LOW (ref 135–145)
Total Protein: 7.3 g/dL (ref 6.0–8.3)

## 2010-10-16 LAB — URINE MICROSCOPIC-ADD ON

## 2010-10-16 LAB — CBC
HCT: 28.2 % — ABNORMAL LOW (ref 36.0–46.0)
Hemoglobin: 11.1 g/dL — ABNORMAL LOW (ref 12.0–15.0)
MCHC: 34 g/dL (ref 30.0–36.0)
MCHC: 34.3 g/dL (ref 30.0–36.0)
MCV: 93.5 fL (ref 78.0–100.0)
RBC: 3.02 MIL/uL — ABNORMAL LOW (ref 3.87–5.11)
RDW: 14.3 % (ref 11.5–15.5)
WBC: 17.3 10*3/uL — ABNORMAL HIGH (ref 4.0–10.5)

## 2010-10-16 LAB — LEGIONELLA ANTIGEN, URINE: Legionella Antigen, Urine: NEGATIVE

## 2010-10-16 LAB — LACTIC ACID, PLASMA
Lactic Acid, Venous: 1.9 mmol/L (ref 0.5–2.2)
Lactic Acid, Venous: 3 mmol/L — ABNORMAL HIGH (ref 0.5–2.2)

## 2010-10-16 LAB — HIV ANTIBODY (ROUTINE TESTING W REFLEX): HIV: NONREACTIVE

## 2010-10-16 LAB — URINE CULTURE

## 2010-10-16 LAB — POCT CARDIAC MARKERS
CKMB, poc: <1 ng/mL — ABNORMAL LOW (ref 1.0–8.0)
Myoglobin, poc: 109 ng/mL (ref 12–200)

## 2010-10-16 LAB — CARDIAC PANEL(CRET KIN+CKTOT+MB+TROPI): Total CK: 119 U/L (ref 7–177)

## 2010-10-20 LAB — CBC
HCT: 28.7 % — ABNORMAL LOW (ref 36.0–46.0)
Hemoglobin: 9.8 g/dL — ABNORMAL LOW (ref 12.0–15.0)
MCHC: 33.8 g/dL (ref 30.0–36.0)
MCHC: 34.1 g/dL (ref 30.0–36.0)
MCV: 93.4 fL (ref 78.0–100.0)
MCV: 93.5 fL (ref 78.0–100.0)
Platelets: 381 10*3/uL (ref 150–400)
Platelets: 393 10*3/uL (ref 150–400)
RDW: 14.3 % (ref 11.5–15.5)
RDW: 14.6 % (ref 11.5–15.5)

## 2010-10-20 LAB — COMPREHENSIVE METABOLIC PANEL
Albumin: 3 g/dL — ABNORMAL LOW (ref 3.5–5.2)
Alkaline Phosphatase: 70 U/L (ref 39–117)
BUN: 8 mg/dL (ref 6–23)
Creatinine, Ser: 0.98 mg/dL (ref 0.4–1.2)
Glucose, Bld: 131 mg/dL — ABNORMAL HIGH (ref 70–99)
Potassium: 4.1 mEq/L (ref 3.5–5.1)
Total Bilirubin: 0.4 mg/dL (ref 0.3–1.2)
Total Protein: 6.8 g/dL (ref 6.0–8.3)

## 2010-10-20 LAB — GLUCOSE, CAPILLARY
Glucose-Capillary: 141 mg/dL — ABNORMAL HIGH (ref 70–99)
Glucose-Capillary: 149 mg/dL — ABNORMAL HIGH (ref 70–99)

## 2010-10-20 LAB — BASIC METABOLIC PANEL
BUN: 7 mg/dL (ref 6–23)
CO2: 25 mEq/L (ref 19–32)
Calcium: 8.7 mg/dL (ref 8.4–10.5)
Chloride: 101 mEq/L (ref 96–112)
Creatinine, Ser: 1 mg/dL (ref 0.4–1.2)

## 2010-10-20 LAB — DIFFERENTIAL
Basophils Absolute: 0 10*3/uL (ref 0.0–0.1)
Basophils Relative: 0 % (ref 0–1)
Lymphocytes Relative: 24 % (ref 12–46)
Monocytes Relative: 8 % (ref 3–12)
Neutro Abs: 5.9 10*3/uL (ref 1.7–7.7)
Neutrophils Relative %: 65 % (ref 43–77)

## 2010-10-20 LAB — TSH: TSH: 1.972 u[IU]/mL (ref 0.350–4.500)

## 2010-10-23 NOTE — Progress Notes (Signed)
Summary: ? allergic reaction to iron  Phone Note Call from Patient Call back at [phone]   Caller: Patient Call For: [name] MD Summary of Call: Dr Welton Flakes with [facility] had given pt 2 iron infusions and then started pt on iron 20mg  otc(iron pill also contains Vit C, B12,folic acid and copper). Pt said she stopped taking this AM because having severe reaction with diarrhea, N&V, itching all over (no rash) . Pt said Benadryl helped some but still some itching. Pt said she contacted Dr Milta Deiters office and was told to contact Dr. Milinda Antis.Please advise.  Pt uses [facility] [address] if pharmacy needed.[name] LPN  October 13, 2010 10:40 AM   Follow-up for Phone Call        stop the oral iron in terms of IV iron -- send copy of this phone note to heme office because I don't know what to recommend  benadryl is ok for itch (oral)- warn of sedation  update if no improvement Follow-up by: [name] MD,  October 13, 2010 11:19 AM  Additional Follow-up for Phone Call Additional follow up Details #1::        Patient notified as instructed by telephone. faxed this note to Dr. Shea Stakes at [facility] thru EMR as instructed.[name] LPN  October 13, 2010 11:49 AM      Appended Document: ? allergic reaction to iron Dr Milta Deiters office called and said that pt did not call their office and was told to contact Dr Royden Purl office. Dr Milta Deiters office has already told pt to stop oral iron in the past and wanted pt to f/u with Dr Milinda Antis for iron studies every 6 months. Dr Milta Deiters office will fax Dr Milta Deiters last note to Dr. Milinda Antis.  Appended Document: ? allergic reaction to iron Received faxed copy from Dr Milta Deiters office and will put on Dr. Royden Purl shelf in the in box.

## 2010-10-25 ENCOUNTER — Emergency Department (HOSPITAL_COMMUNITY)
Admission: EM | Admit: 2010-10-25 | Discharge: 2010-10-25 | Disposition: A | Discharge status: Home / Self Care | Payer: Managed Care, Other (non HMO) | Attending: Emergency Medicine | Admitting: Emergency Medicine

## 2010-10-25 ENCOUNTER — Emergency Department (HOSPITAL_COMMUNITY): Payer: Managed Care, Other (non HMO)

## 2010-10-25 DIAGNOSIS — IMO0002 Reserved for concepts with insufficient information to code with codable children: Secondary | ICD-10-CM | POA: Insufficient documentation

## 2010-10-25 DIAGNOSIS — E7800 Pure hypercholesterolemia, unspecified: Secondary | ICD-10-CM | POA: Insufficient documentation

## 2010-10-25 DIAGNOSIS — E119 Type 2 diabetes mellitus without complications: Secondary | ICD-10-CM | POA: Insufficient documentation

## 2010-10-25 DIAGNOSIS — M25519 Pain in unspecified shoulder: Secondary | ICD-10-CM | POA: Insufficient documentation

## 2010-10-25 DIAGNOSIS — I10 Essential (primary) hypertension: Secondary | ICD-10-CM | POA: Insufficient documentation

## 2010-10-25 DIAGNOSIS — M25529 Pain in unspecified elbow: Secondary | ICD-10-CM | POA: Insufficient documentation

## 2010-10-25 DIAGNOSIS — X58XXXA Exposure to other specified factors, initial encounter: Secondary | ICD-10-CM | POA: Insufficient documentation

## 2010-10-25 DIAGNOSIS — J45909 Unspecified asthma, uncomplicated: Secondary | ICD-10-CM | POA: Insufficient documentation

## 2010-10-25 DIAGNOSIS — F411 Generalized anxiety disorder: Secondary | ICD-10-CM | POA: Insufficient documentation

## 2010-10-25 DIAGNOSIS — I2510 Atherosclerotic heart disease of native coronary artery without angina pectoris: Secondary | ICD-10-CM | POA: Insufficient documentation

## 2010-10-25 DIAGNOSIS — T148XXA Other injury of unspecified body region, initial encounter: Secondary | ICD-10-CM | POA: Insufficient documentation

## 2010-10-28 ENCOUNTER — Telehealth: Payer: Self-pay | Admitting: *Deleted

## 2010-10-28 LAB — CBC
HCT: 34.2 % — ABNORMAL LOW (ref 36.0–46.0)
Hemoglobin: 11.1 g/dL — ABNORMAL LOW (ref 12.0–15.0)
Hemoglobin: 11.6 g/dL — ABNORMAL LOW (ref 12.0–15.0)
MCHC: 34.3 g/dL (ref 30.0–36.0)
MCHC: 34.7 g/dL (ref 30.0–36.0)
MCV: 93.2 fL (ref 78.0–100.0)
MCV: 94 fL (ref 78.0–100.0)
Platelets: 460 10*3/uL — ABNORMAL HIGH (ref 150–400)
RBC: 3.41 MIL/uL — ABNORMAL LOW (ref 3.87–5.11)
RBC: 3.67 MIL/uL — ABNORMAL LOW (ref 3.87–5.11)
RDW: 14 % (ref 11.5–15.5)
WBC: 12.9 10*3/uL — ABNORMAL HIGH (ref 4.0–10.5)

## 2010-10-28 LAB — BASIC METABOLIC PANEL
CO2: 23 mEq/L (ref 19–32)
Calcium: 8.9 mg/dL (ref 8.4–10.5)
Chloride: 107 mEq/L (ref 96–112)
Creatinine, Ser: 0.84 mg/dL (ref 0.4–1.2)
Glucose, Bld: 142 mg/dL — ABNORMAL HIGH (ref 70–99)

## 2010-10-28 LAB — CARDIAC PANEL(CRET KIN+CKTOT+MB+TROPI)
CK, MB: 0.7 ng/mL (ref 0.3–4.0)
CK, MB: 0.9 ng/mL (ref 0.3–4.0)
Relative Index: INVALID (ref 0.0–2.5)

## 2010-10-28 LAB — COMPREHENSIVE METABOLIC PANEL
Albumin: 3.5 g/dL (ref 3.5–5.2)
Alkaline Phosphatase: 85 U/L (ref 39–117)
BUN: 12 mg/dL (ref 6–23)
CO2: 24 mEq/L (ref 19–32)
Chloride: 104 mEq/L (ref 96–112)
GFR calc non Af Amer: 58 mL/min — ABNORMAL LOW (ref >60)
Glucose, Bld: 116 mg/dL — ABNORMAL HIGH (ref 70–99)
Potassium: 3.7 mEq/L (ref 3.5–5.1)
Total Bilirubin: 0.3 mg/dL (ref 0.3–1.2)

## 2010-10-28 LAB — DIFFERENTIAL
Basophils Absolute: 0.1 10*3/uL (ref 0.0–0.1)
Basophils Relative: 1 % (ref 0–1)
Eosinophils Relative: 2 % (ref 0–5)
Monocytes Absolute: 0.8 10*3/uL (ref 0.1–1.0)
Neutro Abs: 7.9 10*3/uL — ABNORMAL HIGH (ref 1.7–7.7)

## 2010-10-28 NOTE — Telephone Encounter (Signed)
Spoke with pt and she was seen at [facility] today and a cast was put on pt's arm due to muscle being pulled from bone, no fx. The orthopedist gave pt rx for pain so please disregard the request for pain med.

## 2010-10-28 NOTE — Telephone Encounter (Signed)
I don't see her on the sched for tomorrow-- am I missing it ? Is it with someone else?

## 2010-10-28 NOTE — Telephone Encounter (Signed)
Patient went to [facility] ER on Saturday, she has a torn muscle in her elbow that is causing her a lot of pain. She scheduled an appt to see you tomorrow, but when she was at the ER they only gave her 5 percocet and told her to take 1 every 4 hrs and she is now completely out and is asking if she can get something called to help with the pain until she is seen tomorrow. Uses [facility] [address]

## 2010-10-29 ENCOUNTER — Ambulatory Visit: Payer: Managed Care, Other (non HMO) | Admitting: Family Medicine

## 2010-11-24 ENCOUNTER — Observation Stay: Payer: Self-pay | Admitting: Specialist

## 2010-11-24 ENCOUNTER — Ambulatory Visit: Payer: Managed Care, Other (non HMO) | Admitting: Cardiovascular Disease

## 2010-11-24 DIAGNOSIS — I200 Unstable angina: Secondary | ICD-10-CM

## 2010-11-25 ENCOUNTER — Encounter: Payer: Self-pay | Admitting: Cardiovascular Disease

## 2010-11-25 DIAGNOSIS — I2510 Atherosclerotic heart disease of native coronary artery without angina pectoris: Secondary | ICD-10-CM

## 2010-12-05 ENCOUNTER — Inpatient Hospital Stay: Payer: Self-pay | Admitting: Psychiatry

## 2010-12-09 ENCOUNTER — Encounter: Payer: Managed Care, Other (non HMO) | Admitting: Cardiovascular Disease

## 2010-12-12 ENCOUNTER — Telehealth: Payer: Self-pay | Admitting: Cardiovascular Disease

## 2010-12-12 NOTE — Telephone Encounter (Signed)
Anna Pena would like to know how she should be taking the Ranexa 1000 mg daily or what?

## 2010-12-12 NOTE — Telephone Encounter (Signed)
Would like a discount card for Niaspan, Crestor, and Amlodepine.

## 2010-12-12 NOTE — Telephone Encounter (Signed)
Notified patient have cards to pick up for niaspan and crestor.

## 2010-12-13 ENCOUNTER — Encounter: Payer: Self-pay | Admitting: Family Medicine

## 2010-12-13 NOTE — Telephone Encounter (Signed)
ranexa is 1000 mg twice a day and used for chest pain. She can try without and see if she feels just as good. If pain returns, would restart ranexa. We have a copay card for ranexa

## 2010-12-15 ENCOUNTER — Encounter: Payer: Managed Care, Other (non HMO) | Admitting: Cardiovascular Disease

## 2010-12-15 ENCOUNTER — Telehealth: Payer: Self-pay | Admitting: Cardiovascular Disease

## 2010-12-15 ENCOUNTER — Ambulatory Visit: Payer: Managed Care, Other (non HMO) | Admitting: Family Medicine

## 2010-12-15 DIAGNOSIS — Z0289 Encounter for other administrative examinations: Secondary | ICD-10-CM

## 2010-12-15 NOTE — Telephone Encounter (Signed)
Notified patient have samples of Ranexa 1000 mg she is to take one tablet twice a day.

## 2010-12-15 NOTE — Telephone Encounter (Signed)
Pt notified samples are left up front.

## 2010-12-15 NOTE — Telephone Encounter (Signed)
Pt would like samples of Ranexa 1000 mg 

## 2010-12-23 ENCOUNTER — Encounter: Payer: Managed Care, Other (non HMO) | Admitting: Cardiovascular Disease

## 2010-12-29 ENCOUNTER — Encounter: Payer: Managed Care, Other (non HMO) | Admitting: Cardiovascular Disease

## 2011-01-01 ENCOUNTER — Encounter: Payer: Self-pay | Admitting: Cardiovascular Disease

## 2011-01-02 ENCOUNTER — Encounter: Payer: Self-pay | Admitting: Family Medicine

## 2011-01-02 ENCOUNTER — Ambulatory Visit (INDEPENDENT_AMBULATORY_CARE_PROVIDER_SITE_OTHER): Payer: Managed Care, Other (non HMO) | Admitting: Family Medicine

## 2011-01-02 DIAGNOSIS — I10 Essential (primary) hypertension: Secondary | ICD-10-CM

## 2011-01-02 DIAGNOSIS — R609 Edema, unspecified: Secondary | ICD-10-CM

## 2011-01-02 DIAGNOSIS — F411 Generalized anxiety disorder: Secondary | ICD-10-CM

## 2011-01-02 DIAGNOSIS — G5790 Unspecified mononeuropathy of unspecified lower limb: Secondary | ICD-10-CM

## 2011-01-02 DIAGNOSIS — E119 Type 2 diabetes mellitus without complications: Secondary | ICD-10-CM

## 2011-01-02 DIAGNOSIS — D649 Anemia, unspecified: Secondary | ICD-10-CM

## 2011-01-02 MED ORDER — EXENATIDE 5 MCG/0.02ML ~~LOC~~ SOPN: 5.0000 ug | PEN_INJECTOR | Freq: Two times a day (BID) | SUBCUTANEOUS | Status: DC

## 2011-01-02 MED ORDER — SPIRONOLACTONE 25 MG PO TABS: 25.0000 mg | ORAL_TABLET | Freq: Every day | ORAL | Status: DC

## 2011-01-02 NOTE — Patient Instructions (Signed)
Continue metformin for now  NO sweet tea  Cut fruit to 2-3 oz servings with meals Re read the info I gave you about diabetes  Start spironolactone for swelling  Follow up with me in 1 week  byetta - start that twice daily  Check sugar twice daily

## 2011-01-02 NOTE — Assessment & Plan Note (Signed)
Good control Adding spironolactone for swelling  F/u 1 wk Watch closely for hypotension- pt will check at home

## 2011-01-02 NOTE — Assessment & Plan Note (Signed)
Some better after psych hosp and med change Better outlook

## 2011-01-02 NOTE — Assessment & Plan Note (Signed)
No imp on metformin Needs to be better about diet -made some rec Cannot afford opthy or dm teaching unfortunately(Needs to get her eye checked out!) Will add byetta if affordible  Disc usage and what to expect F/u 1 wk

## 2011-01-02 NOTE — Assessment & Plan Note (Signed)
Pt ? Allergic/ non tol to iron inf On oral now and doing well  Sees heme  Energy has improved

## 2011-01-02 NOTE — Progress Notes (Signed)
Subjective:    Patient ID: Anna Pena, female    DOB: 08-20-1965, 45 y.o.   MRN: [id number]  HPI Here for f/u of DM Last seen sugars 300s and up  Could not afford DM teaching  Given metformin on 1000 bid  Diet- lost 2 lb  Has been walking daily -- getting up to walk  Is hard because neuropathy is bad -- ice packs on her feet at night  On neurontin 1200 mg tid   Diet is "some better" -- weakness is sweet tea  Too much fruit perhaps  Eats lots of salads - grilled chicken  Hard time digesting lettuce  More lettuce   Sugars are still running 350- and up   Had another heart cath -- and had to have insulin in the hospital   Metformin no improvement   Is interested in byetta   Vision in L eye is worse  Cannot afford to see eye doctor right now   Iron inf- ? Allergic / did not tolerate Taking oral iron and feeling better   A lot of swelling in her fingers and feet = want diuretic    Had to admit herself to [facility] over mother's day weekend  Not suicidal The admission was very helpful Has better attitude now  Brought her closer to her family  Mental illness - very common in her family  Patient Active Problem List  Diagnoses  . UNSPECIFIED VITAMIN D DEFICIENCY  . HYPERLIPIDEMIA  . ANEMIA-NOS  . LEUKOCYTOSIS  . ANXIETY  . DEPRESSION  . MIGRAINE HEADACHE  . PERIPHERAL NEUROPATHY, FEET  . HYPERTENSION, BENIGN  . CAD, NATIVE VESSEL  . GERD  . MELASMA  . BACK PAIN  . FATIGUE  . EDEMA  . TOBACCO USE, QUIT  . DIABETES-TYPE 2   Past Medical History  Diagnosis Date  . Hypertension   . Anxiety and depression   . Diabetes mellitus   . CAD (coronary artery disease)     diffuse, multivessel, small vessel disease  . Pneumonia 2011    hospital acquired  . Heart murmur     no echo  . Hx: UTI (urinary tract infection)   . Anemia     with IV iron treatment  . Migraines   . History of kidney stones   . GERD (gastroesophageal reflux disease)   . Menopausal state       not on hormones  . Vitamin D deficiency   . Chronic back pain   . Dizziness     chronic, of ? etiology- with 1 ENT and 2 neuro work ups  . Iron deficiency     dx'd from bone marrow biopsy  . High blood pressure     Readings  . Hyperlipidemia     triglycerides; likely familial   Past Surgical History  Procedure Date  . Cholecystectomy 2006  . Breast biopsy 2005  . Tonsillectomy 1989  . Gyn surgery 2000    hysterectomy- total, for adhesions/ endometriosis  . Cardiac catheterization 2010    multivessel CAD, treated medically  . Bone marrow biopsy 08/2010    normal with low iron stores   History  Substance Use Topics  . Smoking status: Former Smoker    Quit date: 06/26/2009  . Smokeless tobacco: Never Used   Comment: Quit   . Alcohol Use: No   Family History  Problem Relation Age of Onset  . Diabetes Mother   . Hyperlipidemia Mother   . Coronary artery disease Mother  hx of CABG in 40s  . Heart disease Mother     MI age 82, 4 vessel CABG  . Diabetes Father   . Hyperlipidemia Father   . Cancer Neg Hx   . Depression Neg Hx   . Anxiety disorder Neg Hx   . Heart disease Other     Grandparent  . Hypertension Other     Parent, grandparent, other family member  . Diabetes Other     Grandparent  . Kidney disease Other     Grandparent   Allergies  Allergen Reactions  . Ciprofloxacin   . Erythromycin   . Oxycodone-Acetaminophen   . Sulfonamide Derivatives   . Sumatriptan    Current Outpatient Prescriptions on File Prior to Visit  Medication Sig Dispense Refill  . albuterol (PROAIR HFA) 108 (90 BASE) MCG/ACT inhaler 2 puffs up to every 4 hours as needed for wheezing       . amLODipine (NORVASC) 5 MG tablet Take 5 mg by mouth daily.        Marland Kitchen aspirin 81 MG tablet Take 2 tablets by mouth at bedtime       . diazepam (VALIUM) 10 MG tablet Take 20 mg by mouth at bedtime as needed.        . DULoxetine (CYMBALTA) 30 MG capsule Take 90 mg by mouth daily.        Marland Kitchen  gabapentin (NEURONTIN) 600 MG tablet Take 1,200 mg by mouth 3 (three) times daily.        . metFORMIN (GLUCOPHAGE) 1000 MG tablet Take 1,000 mg by mouth 2 (two) times daily with a meal.       . metoprolol tartrate (LOPRESSOR) 25 MG tablet Take 25 mg by mouth 2 (two) times daily.        . niacin (NIASPAN) 750 MG CR tablet Take 1,500 mg by mouth at bedtime.        . Omega-3 Fatty Acids (FISH OIL) 1000 MG CAPS Take one by mouth 2 times a day       . rosuvastatin (CRESTOR) 40 MG tablet Take 40 mg by mouth daily.        . calcium carbonate (TUMS) 500 MG chewable tablet Take as needed       . ergocalciferol (VITAMIN D2) 50000 UNITS capsule Take 50,000 Units by mouth once a week. Times 8 weeks       . exenatide (BYETTA 5 MCG PEN) 5 MCG/0.02ML SOLN Inject 0.02 mLs (5 mcg total) into the skin 2 (two) times daily with a meal. Pen please if availible  1.2 mL  11  . nitroGLYCERIN (NITROSTAT) 0.4 MG SL tablet Use as needed       . omeprazole-sodium bicarbonate (ZEGERID) 40-1100 MG per capsule Take 1 capsule by mouth daily.        Marland Kitchen tiotropium (SPIRIVA HANDIHALER) 18 MCG inhalation capsule Place 18 mcg into inhaler and inhale daily.            Review of Systems Review of Systems  Constitutional: Negative for fever, appetite change, fatigue and unexpected weight change.  Eyes: Negative for pain and visual disturbance.  Respiratory: Negative for cough and shortness of breath.   Cardiovascular: Negative for cp or sob or palpitations.   Gastrointestinal: Negative for nausea, diarrhea and constipation.  Genitourinary: Negative for urgency and frequency.  Skin: Negative for pallor. no rash  Neurological: Negative for weakness, light-headedness, numbness and headaches.  Hematological: Negative for adenopathy. Does not bruise/bleed easily.  Psychiatric/Behavioral: depression  and anxiety are improved         Objective:   Physical Exam  Constitutional: She appears well-developed and well-nourished. No  distress.  HENT:  Head: Normocephalic and atraumatic.  Right Ear: External ear normal.  Left Ear: External ear normal.  Eyes: Conjunctivae and EOM are normal. Pupils are equal, round, and reactive to light.  Neck: Normal range of motion. Neck supple. No JVD present. Carotid bruit is not present. No thyromegaly present.  Cardiovascular: Normal rate, regular rhythm, normal heart sounds and intact distal pulses.   Pulmonary/Chest: Effort normal and breath sounds normal. No respiratory distress. She has no wheezes.       No crackles   Abdominal: Soft. Bowel sounds are normal. She exhibits no mass. There is no tenderness.  Musculoskeletal: She exhibits edema. She exhibits no tenderness.       Trace edema in ankles and feet    Lymphadenopathy:    She has no cervical adenopathy.  Neurological: She is alert. No cranial nerve deficit. Coordination normal.  Skin: Skin is warm and dry. No rash noted. No erythema. No pallor.  Psychiatric: She has a normal mood and affect.       Improved affect - more positive outlook Not tearful Good eye contact           Assessment & Plan:

## 2011-01-02 NOTE — Assessment & Plan Note (Signed)
Mild but very bothersome -- and this makes neuropathy burning worse  Will tx with spironolactone (? Perhaps help sugar) Need to watch lytes/ K carefully F/u 1 wk for visit and labs

## 2011-01-02 NOTE — Assessment & Plan Note (Signed)
More severe with high sugars Suspect dec sugars will help  Also dec in edema Aware neurontin can cause edema  ? If will have to inc it however

## 2011-01-05 ENCOUNTER — Encounter: Payer: Managed Care, Other (non HMO) | Admitting: Cardiovascular Disease

## 2011-01-06 ENCOUNTER — Telehealth: Payer: Self-pay | Admitting: *Deleted

## 2011-01-06 NOTE — Telephone Encounter (Signed)
Patient notified as instructed by telephone. 

## 2011-01-06 NOTE — Telephone Encounter (Signed)
Pt called to report that she is having a lot of swelling in her hands and ankles and she is asking if she can start taking 2 spironolactones a day, instead of one. She wants to take one in the morning and one at night.  She is checking her blood pressure and blood sugar daily and keeping logs.  Also she is using byetta and she is having some nausea with this, Asks if she can have something for the nausea called to [facility] [address].

## 2011-01-06 NOTE — Telephone Encounter (Signed)
She has appt o 15th- will wait until then to adv re: spironolactone -- want to see the swelling first and get labs Usually the nausea with byetta is short lived - can try some dramamine otc for a few days (watch out for sedation) and will disc further on Friday

## 2011-01-09 ENCOUNTER — Ambulatory Visit (INDEPENDENT_AMBULATORY_CARE_PROVIDER_SITE_OTHER): Payer: Managed Care, Other (non HMO) | Admitting: Family Medicine

## 2011-01-09 ENCOUNTER — Encounter: Payer: Self-pay | Admitting: Family Medicine

## 2011-01-09 DIAGNOSIS — I10 Essential (primary) hypertension: Secondary | ICD-10-CM

## 2011-01-09 DIAGNOSIS — E119 Type 2 diabetes mellitus without complications: Secondary | ICD-10-CM

## 2011-01-09 DIAGNOSIS — R609 Edema, unspecified: Secondary | ICD-10-CM

## 2011-01-09 DIAGNOSIS — E785 Hyperlipidemia, unspecified: Secondary | ICD-10-CM

## 2011-01-09 LAB — RENAL FUNCTION PANEL
Albumin: 4.8 g/dL (ref 3.5–5.2)
Calcium: 9.8 mg/dL (ref 8.4–10.5)
Creat: 1.11 mg/dL — ABNORMAL HIGH (ref 0.50–1.10)
Glucose, Bld: 126 mg/dL — ABNORMAL HIGH (ref 70–99)
Sodium: 139 mEq/L (ref 135–145)

## 2011-01-09 LAB — LIPID PANEL
LDL Cholesterol: 32 mg/dL (ref 0–99)
Triglycerides: 307 mg/dL — ABNORMAL HIGH (ref <150)

## 2011-01-09 LAB — ALT: ALT: 25 U/L (ref 0–35)

## 2011-01-09 NOTE — Assessment & Plan Note (Signed)
impoving with byetta Is nauseated - hope this will imp in next 2 wk  Loosing wt  Keeping updated  If ok 1 mo can adv to 10 from 5 mg

## 2011-01-09 NOTE — Assessment & Plan Note (Signed)
Improved on spironolactone- would like to inc dose if able Lab today and will update

## 2011-01-09 NOTE — Progress Notes (Signed)
Subjective:    Patient ID: Anna Pena, female    DOB: 07-Mar-1966, 45 y.o.   MRN: [id number]  HPI Here for f/u of DM and edema  Started on low dose byetta Is starting to work -- sugars are no longer above 350- thrilled with this  Lowest was 140 Some up to mid 200s Rarely over 300 Overall still high but much imp so far Also on metformin   Is nauseated from med- getting used to it  Also loosing wt     Started on aldactone 25 This is helping edema - would like to inc dose if poss  bp is in very good control Not too low No ha or cp  Wt is down 6l b  bp is great 114/76  No angina or heart problems lately  Had cath in may -- no changes since dec 2010  Neuropathy is really bad  Already on high doses of neurontin ? If poss norvasc and also edema is worsening this  Knows better sugars will help   Bad vaginal dryness with her meds  Will try otc lubricant - if no success would consider premarin cream   Patient Active Problem List  Diagnoses  . UNSPECIFIED VITAMIN D DEFICIENCY  . HYPERLIPIDEMIA  . ANEMIA-NOS  . LEUKOCYTOSIS  . ANXIETY  . DEPRESSION  . MIGRAINE HEADACHE  . PERIPHERAL NEUROPATHY, FEET  . HYPERTENSION, BENIGN  . CAD, NATIVE VESSEL  . GERD  . MELASMA  . BACK PAIN  . FATIGUE  . EDEMA  . TOBACCO USE, QUIT  . DIABETES-TYPE 2   Past Medical History  Diagnosis Date  . Hypertension   . Anxiety and depression   . Diabetes mellitus   . CAD (coronary artery disease)     diffuse, multivessel, small vessel disease  . Pneumonia 2011    hospital acquired  . Heart murmur     no echo  . Hx: UTI (urinary tract infection)   . Anemia     with IV iron treatment  . Migraines   . History of kidney stones   . GERD (gastroesophageal reflux disease)   . Menopausal state     not on hormones  . Vitamin D deficiency   . Chronic back pain   . Dizziness     chronic, of ? etiology- with 1 ENT and 2 neuro work ups  . Iron deficiency     dx'd from bone marrow  biopsy  . High blood pressure     Readings  . Hyperlipidemia     triglycerides; likely familial   Past Surgical History  Procedure Date  . Cholecystectomy 2006  . Breast biopsy 2005  . Tonsillectomy 1989  . Gyn surgery 2000    hysterectomy- total, for adhesions/ endometriosis  . Cardiac catheterization 2010    multivessel CAD, treated medically  . Bone marrow biopsy 08/2010    normal with low iron stores   History  Substance Use Topics  . Smoking status: Former Smoker    Quit date: 06/26/2009  . Smokeless tobacco: Never Used   Comment: Quit   . Alcohol Use: No   Family History  Problem Relation Age of Onset  . Diabetes Mother   . Hyperlipidemia Mother   . Coronary artery disease Mother     hx of CABG in 42s  . Heart disease Mother     MI age 31, 4 vessel CABG  . Diabetes Father   . Hyperlipidemia Father   . Cancer Neg Hx   .  Depression Neg Hx   . Anxiety disorder Neg Hx   . Heart disease Other     Grandparent  . Hypertension Other     Parent, grandparent, other family member  . Diabetes Other     Grandparent  . Kidney disease Other     Grandparent   Allergies  Allergen Reactions  . Ciprofloxacin   . Erythromycin   . Oxycodone-Acetaminophen   . Sulfonamide Derivatives   . Sumatriptan    Current Outpatient Prescriptions on File Prior to Visit  Medication Sig Dispense Refill  . amLODipine (NORVASC) 5 MG tablet Take 5 mg by mouth daily.        . ARIPiprazole (ABILIFY) 5 MG tablet Take 5 mg by mouth daily.        . diazepam (VALIUM) 10 MG tablet Take 20 mg by mouth at bedtime as needed.        . DULoxetine (CYMBALTA) 30 MG capsule Take 90 mg by mouth daily.        Marland Kitchen exenatide (BYETTA 5 MCG PEN) 5 MCG/0.02ML SOLN Inject 0.02 mLs (5 mcg total) into the skin 2 (two) times daily with a meal. Pen please if availible  1.2 mL  11  . gabapentin (NEURONTIN) 600 MG tablet Take 1,200 mg by mouth 3 (three) times daily.        . metFORMIN (GLUCOPHAGE) 1000 MG tablet Take  1,000 mg by mouth 2 (two) times daily with a meal.       . metoprolol tartrate (LOPRESSOR) 25 MG tablet Take 25 mg by mouth 2 (two) times daily.        . mirtazapine (REMERON) 45 MG tablet Take 45 mg by mouth at bedtime.        . niacin (NIASPAN) 750 MG CR tablet Take 1,500 mg by mouth at bedtime.        . Omega-3 Fatty Acids (FISH OIL) 1000 MG CAPS Take one by mouth 2 times a day       . ranolazine (RANEXA) 1000 MG SR tablet Take 1,000 mg by mouth 2 (two) times daily.       . rosuvastatin (CRESTOR) 40 MG tablet Take 40 mg by mouth daily.        Marland Kitchen spironolactone (ALDACTONE) 25 MG tablet Take 1 tablet (25 mg total) by mouth daily.  60 tablet  11  . albuterol (PROAIR HFA) 108 (90 BASE) MCG/ACT inhaler 2 puffs up to every 4 hours as needed for wheezing       . aspirin 81 MG tablet Take 2 tablets by mouth at bedtime       . calcium carbonate (TUMS) 500 MG chewable tablet Take as needed       . ergocalciferol (VITAMIN D2) 50000 UNITS capsule Take 50,000 Units by mouth once a week. Times 8 weeks       . nitroGLYCERIN (NITROSTAT) 0.4 MG SL tablet Use as needed       . omeprazole-sodium bicarbonate (ZEGERID) 40-1100 MG per capsule Take 1 capsule by mouth daily.        Marland Kitchen tiotropium (SPIRIVA HANDIHALER) 18 MCG inhalation capsule Place 18 mcg into inhaler and inhale daily.                 Review of Systems Review of Systems  Constitutional: Negative for fever, appetite change, fatigue and unexpected weight change.  Eyes: Negative for pain and visual disturbance.  Respiratory: Negative for cough and shortness of breath.   Cardiovascular: Negative.  for cp or palpitations /pos for edema that is improved Gastrointestinal: Negative for nausea, diarrhea and constipation.  Genitourinary: Negative for urgency and frequency.  Skin: Negative for pallor. no rash  Neurological: Negative for weakness, light-headedness, and headaches. - pos for numbness/ burning of feet  Hematological: Negative for  adenopathy. Does not bruise/bleed easily.  Psych-- pos for dep and anx- which has improved           Objective:   Physical Exam  Constitutional: She appears well-developed and well-nourished. No distress.       overwt and well appearing   HENT:  Head: Normocephalic and atraumatic.  Right Ear: External ear normal.  Left Ear: External ear normal.  Eyes: Conjunctivae and EOM are normal. Pupils are equal, round, and reactive to light.  Neck: Normal range of motion. Neck supple. No JVD present. Carotid bruit is not present. No thyromegaly present.  Cardiovascular: Normal rate, regular rhythm, normal heart sounds and intact distal pulses.   Pulmonary/Chest: Effort normal and breath sounds normal. No respiratory distress. She has no wheezes.       Diffusely distant bs  No wheeze  Abdominal: Soft. Bowel sounds are normal. She exhibits no distension and no abdominal bruit. There is no tenderness.  Musculoskeletal: She exhibits no tenderness.       Trace edema in ankles at most   Lymphadenopathy:    She has no cervical adenopathy.  Neurological: She is alert. She has normal reflexes. No cranial nerve deficit. Coordination normal.  Skin: Skin is warm and dry. No rash noted. No erythema. No pallor.  Psychiatric:       Talkative and energetic today Not tearful Good eye contact          Assessment & Plan:

## 2011-01-09 NOTE — Assessment & Plan Note (Signed)
bp well controlled In light of neuropathy and edema - will hold norvasc for a while to see if imp  If cp however- needs to re start - pt voices understanding

## 2011-01-09 NOTE — Assessment & Plan Note (Signed)
Labs today - and will comment Much lower fat diet

## 2011-01-09 NOTE — Patient Instructions (Signed)
I'm glad your sugars are coming down  Hold your amlodipine / norvasc -- but if you develop chest pain - start it again and let me know  This may help swelling and neuropathy  Continue byetta - but if nausea does not improve in 2 weeks call and let me know  Continue spironolactone  Labs today  I will update you when labs return and make a plan

## 2011-01-12 ENCOUNTER — Ambulatory Visit: Payer: Managed Care, Other (non HMO) | Admitting: Family Medicine

## 2011-01-15 ENCOUNTER — Telehealth: Payer: Self-pay | Admitting: *Deleted

## 2011-01-15 NOTE — Telephone Encounter (Signed)
Patient called and stated that she thinks she had a seizure. She says that her "eyes were stuck", her legs and arms were jerking and she was unable to do anything. She says that this lasted about 30 seconds. I advised her to go to ER.

## 2011-01-15 NOTE — Telephone Encounter (Addendum)
Noted.  Will route to PCP to be aware.

## 2011-01-16 NOTE — Telephone Encounter (Signed)
Agree with above recommendation- to go to the ER

## 2011-02-13 ENCOUNTER — Ambulatory Visit: Payer: Managed Care, Other (non HMO) | Admitting: Cardiovascular Disease

## 2011-02-20 ENCOUNTER — Encounter: Payer: Self-pay | Admitting: Cardiovascular Disease

## 2011-02-20 ENCOUNTER — Ambulatory Visit (INDEPENDENT_AMBULATORY_CARE_PROVIDER_SITE_OTHER): Payer: Managed Care, Other (non HMO) | Admitting: Cardiovascular Disease

## 2011-02-20 DIAGNOSIS — F3289 Other specified depressive episodes: Secondary | ICD-10-CM

## 2011-02-20 DIAGNOSIS — F411 Generalized anxiety disorder: Secondary | ICD-10-CM

## 2011-02-20 DIAGNOSIS — I2510 Atherosclerotic heart disease of native coronary artery without angina pectoris: Secondary | ICD-10-CM

## 2011-02-20 DIAGNOSIS — I10 Essential (primary) hypertension: Secondary | ICD-10-CM

## 2011-02-20 DIAGNOSIS — F329 Major depressive disorder, single episode, unspecified: Secondary | ICD-10-CM

## 2011-02-20 DIAGNOSIS — E785 Hyperlipidemia, unspecified: Secondary | ICD-10-CM

## 2011-02-20 DIAGNOSIS — E119 Type 2 diabetes mellitus without complications: Secondary | ICD-10-CM

## 2011-02-20 NOTE — Assessment & Plan Note (Signed)
Cholesterol is at goal on the current lipid regimen. No changes to the medications were made.  

## 2011-02-20 NOTE — Progress Notes (Signed)
Patient ID: Anna Pena, female    DOB: [birth year], 45 y.o.   MRN: [id number]  HPI Comments: 45 year-old woman with diffuse small vessel CAD, long history of smoking who stopped smoking in 2010, obesity with 20 pound weight gain over the past several months, anxiety/depression who presents for routine followup. She has had recent episodes of pneumonia.   she reports that she has severe stress at home. She has family members living in her house as they are homeless. These include her grandchildren.  She reports they are causing severe stress to her life.  She has been unable to sleep secondary to stress. She takes Valium up to 4 tabs at a time and is unable to sleep. She also has a higher dose of Cymbalta which has not helped.No significant chest pain here she is not active she does not participate in any exercise program though she does on an elliptical at home.   She does have a reaction to contrast.   EKG shows sinus Rhythm with rate 87 beats per minute with no significant ST or T wave changes, no significant ST or T wave changes      Outpatient Encounter Prescriptions as of 02/20/2011  Medication Sig Dispense Refill  . albuterol (PROAIR HFA) 108 (90 BASE) MCG/ACT inhaler 2 puffs up to every 4 hours as needed for wheezing       . amLODipine (NORVASC) 5 MG tablet Take 5 mg by mouth daily.        . ARIPiprazole (ABILIFY) 5 MG tablet Take 5 mg by mouth daily.        Marland Kitchen aspirin 81 MG tablet Take 2 tablets by mouth at bedtime       . calcium carbonate (TUMS) 500 MG chewable tablet Take as needed       . diazepam (VALIUM) 10 MG tablet Take 20 mg by mouth at bedtime as needed.        . DULoxetine (CYMBALTA) 30 MG capsule Take 90 mg by mouth daily.        . ergocalciferol (VITAMIN D2) 50000 UNITS capsule Take 50,000 Units by mouth once a week. Times 8 weeks       . exenatide (BYETTA 5 MCG PEN) 5 MCG/0.02ML SOLN Inject 0.02 mLs (5 mcg total) into the skin 2 (two) times daily with a meal. Pen please  if availible  1.2 mL  11  . gabapentin (NEURONTIN) 600 MG tablet Take 1,200 mg by mouth 3 (three) times daily.        . metFORMIN (GLUCOPHAGE) 1000 MG tablet Take 1,000 mg by mouth 2 (two) times daily with a meal.       . metoprolol tartrate (LOPRESSOR) 25 MG tablet Take 25 mg by mouth 2 (two) times daily.        . mirtazapine (REMERON) 45 MG tablet Take 45 mg by mouth at bedtime.        . niacin (NIASPAN) 750 MG CR tablet Take 1,500 mg by mouth at bedtime.        . nitroGLYCERIN (NITROSTAT) 0.4 MG SL tablet Use as needed       . Omega-3 Fatty Acids (FISH OIL) 1000 MG CAPS Take one by mouth 2 times a day       . omeprazole-sodium bicarbonate (ZEGERID) 40-1100 MG per capsule Take 1 capsule by mouth daily.        . ranolazine (RANEXA) 1000 MG SR tablet Take 1,000 mg by mouth 2 (two) times daily.       Marland Kitchen  rosuvastatin (CRESTOR) 40 MG tablet Take 40 mg by mouth daily.        Marland Kitchen spironolactone (ALDACTONE) 25 MG tablet Take 1 tablet (25 mg total) by mouth daily.  60 tablet  11  . tiotropium (SPIRIVA HANDIHALER) 18 MCG inhalation capsule Place 18 mcg into inhaler and inhale daily.           Review of Systems  Constitutional: Negative.   HENT: Negative.   Eyes: Negative.   Respiratory: Negative.   Cardiovascular: Negative.   Gastrointestinal: Negative.   Musculoskeletal: Negative.   Skin: Negative.   Neurological: Negative.   Hematological: Negative.   Psychiatric/Behavioral: Positive for sleep disturbance. The patient is nervous/anxious.   All other systems reviewed and are negative.    BP 117/81  Pulse 89  Ht 5\' 5"  (1.651 m)  Wt 204 lb (92.534 kg)  BMI 33.95 kg/m2  Physical Exam  Nursing note and vitals reviewed. Constitutional: She is oriented to person, place, and time. She appears well-developed and well-nourished.  HENT:  Head: Normocephalic.  Nose: Nose normal.  Mouth/Throat: Oropharynx is clear and moist.  Eyes: Conjunctivae are normal. Pupils are equal, round, and reactive  to light.  Neck: Normal range of motion. Neck supple. No JVD present.  Cardiovascular: Normal rate, regular rhythm, S1 normal, S2 normal, normal heart sounds and intact distal pulses.  Exam reveals no gallop and no friction rub.   No murmur heard. Pulmonary/Chest: Effort normal and breath sounds normal. No respiratory distress. She has no wheezes. She has no rales. She exhibits no tenderness.  Abdominal: Soft. Bowel sounds are normal. She exhibits no distension. There is no tenderness.  Musculoskeletal: Normal range of motion. She exhibits no edema and no tenderness.  Lymphadenopathy:    She has no cervical adenopathy.  Neurological: She is alert and oriented to person, place, and time. Coordination normal.  Skin: Skin is warm and dry. No rash noted. No erythema.  Psychiatric: She has a normal mood and affect. Her behavior is normal. Judgment and thought content normal.         Assessment and Plan

## 2011-02-20 NOTE — Assessment & Plan Note (Signed)
Blood pressure is well controlled on today's visit. No changes made to the medications. 

## 2011-02-20 NOTE — Assessment & Plan Note (Signed)
We have encouraged continued exercise, careful diet management in an effort to lose weight. 

## 2011-02-20 NOTE — Assessment & Plan Note (Signed)
Currently with no symptoms of angina. No further workup at this time. Continue current medication regimen. 

## 2011-02-20 NOTE — Assessment & Plan Note (Signed)
Continued psychosocial problems at home with her living situation. She does not seem to be in any readily solvable Situation.

## 2011-02-20 NOTE — Patient Instructions (Addendum)
You are doing well. No medication changes were made. Please call us if you have new issues that need to be addressed before your next appt.  We will call you for a follow up Appt. In 1 year

## 2011-03-03 ENCOUNTER — Telehealth: Payer: Self-pay | Admitting: *Deleted

## 2011-03-03 NOTE — Telephone Encounter (Signed)
Patient says that her neuropathy is really bothering her. She says that the burning is really bad, it hurts to walk, hurts when she is sitting. She has been putting ice packs in her socks to try and help with the burning. She  is asking if she can increase her gabapentin or if there is anything else she can try. Please advise. Uses [facility] on [address].

## 2011-03-03 NOTE — Telephone Encounter (Signed)
From the chart it looks like she takes 1200 mg three times daily and that is the maximum dose recommended  Has she tried lyrica-? - if not have her check her insurance to see if it is covered and let me know either way

## 2011-03-04 NOTE — Telephone Encounter (Signed)
Patient notified as instructed by telephone. Pt has not tried Lyrica. Pt will ck with her insurance co and call back with info.

## 2011-03-11 ENCOUNTER — Other Ambulatory Visit: Payer: Self-pay | Admitting: Cardiovascular Disease

## 2011-03-11 NOTE — Telephone Encounter (Signed)
Requested a refill on niaspan

## 2011-03-16 ENCOUNTER — Other Ambulatory Visit: Payer: Managed Care, Other (non HMO)

## 2011-03-16 DIAGNOSIS — Z0289 Encounter for other administrative examinations: Secondary | ICD-10-CM

## 2011-03-20 ENCOUNTER — Ambulatory Visit: Payer: Managed Care, Other (non HMO) | Admitting: Family Medicine

## 2011-03-20 DIAGNOSIS — Z029 Encounter for administrative examinations, unspecified: Secondary | ICD-10-CM

## 2011-04-01 ENCOUNTER — Other Ambulatory Visit: Payer: Self-pay | Admitting: Cardiovascular Disease

## 2011-04-01 NOTE — Telephone Encounter (Signed)
Needs a refill for metoprolol tartrate 25 mg take one tablet bid.

## 2011-04-13 ENCOUNTER — Other Ambulatory Visit: Payer: Managed Care, Other (non HMO)

## 2011-04-15 ENCOUNTER — Ambulatory Visit: Payer: Managed Care, Other (non HMO) | Admitting: Family Medicine

## 2011-04-29 ENCOUNTER — Observation Stay: Payer: Self-pay | Admitting: Specialist

## 2011-04-29 DIAGNOSIS — R072 Precordial pain: Secondary | ICD-10-CM

## 2011-04-30 ENCOUNTER — Encounter: Payer: Self-pay | Admitting: Family Medicine

## 2011-05-11 ENCOUNTER — Encounter: Payer: Self-pay | Admitting: Family Medicine

## 2011-05-11 ENCOUNTER — Ambulatory Visit (INDEPENDENT_AMBULATORY_CARE_PROVIDER_SITE_OTHER): Payer: Managed Care, Other (non HMO) | Admitting: Family Medicine

## 2011-05-11 VITALS — BP 116/78 | HR 88 | Temp 97.8°F | Wt 199.8 lb

## 2011-05-11 DIAGNOSIS — I2510 Atherosclerotic heart disease of native coronary artery without angina pectoris: Secondary | ICD-10-CM

## 2011-05-11 DIAGNOSIS — G5790 Unspecified mononeuropathy of unspecified lower limb: Secondary | ICD-10-CM

## 2011-05-11 DIAGNOSIS — E119 Type 2 diabetes mellitus without complications: Secondary | ICD-10-CM

## 2011-05-11 MED ORDER — LIRAGLUTIDE 18 MG/3ML ~~LOC~~ SOLN: 1.2000 mg | Freq: Every day | SUBCUTANEOUS | Status: DC

## 2011-05-11 NOTE — Assessment & Plan Note (Signed)
This is again out of control  byetta worked well for a while- but per pt sugar is again out of control and she is very interested in trying victoza I expl this can also cause nausea  Will start with .6 daily and inc to 1.2 mg if tolerated Again rev DM diet and opthy Lab and f/u planned

## 2011-05-11 NOTE — Patient Instructions (Signed)
Stop the byetta  Start victoza instead - use .6 mg injection once daily - if ok after a week then increase to 1.2 mg once daily  Update me in 2 weeks with how you are doing or earlier if side effects or problems  Follow up with your cardiologist as planned Try to check your sugar twice daily  Schedule labs in 2 months and then follow up

## 2011-05-11 NOTE — Assessment & Plan Note (Signed)
Hoping for some improvement in symptoms with better sugar control  Is currently on gabapentin max

## 2011-05-11 NOTE — Progress Notes (Signed)
Subjective:    Patient ID: Anna Pena, female    DOB: 08/15/1965, 45 y.o.   MRN: [id number]  HPI Here for f/u of CP- was in [facility] ER and admitted on 10/2 Reviewed her H and P- has known cardiac dz with last cath in may with Dr Mariah Milling  Had nl EKG and was kept to watch enzymes Did echo and everything was fine  Told her it was stress related  Still much stress at home    now cp is gone  No sob or palpitations either  Has cardiol f/u upcoming   bp 116/78 good  DM- sugars are high again  byetta worked for a while -- for 2 months - and then stopped working  Still taking it  Sugars are close to 300 again  Still nauseated - and does not eat much  Feels dizzy at times - head spinning - and worse to change position  Is interested in victroza - she is interested in that   Got flu shot at [facility] 1 mo ago  Patient Active Problem List  Diagnoses  . UNSPECIFIED VITAMIN D DEFICIENCY  . HYPERLIPIDEMIA  . ANEMIA-NOS  . LEUKOCYTOSIS  . ANXIETY  . DEPRESSION  . MIGRAINE HEADACHE  . PERIPHERAL NEUROPATHY, FEET  . HYPERTENSION, BENIGN  . CAD, NATIVE VESSEL  . GERD  . MELASMA  . BACK PAIN  . FATIGUE  . EDEMA  . TOBACCO USE, QUIT  . DIABETES-TYPE 2   Past Medical History  Diagnosis Date  . Hypertension   . Anxiety and depression   . Diabetes mellitus   . CAD (coronary artery disease)     diffuse, multivessel, small vessel disease  . Pneumonia 2011    hospital acquired  . Heart murmur     no echo  . Hx: UTI (urinary tract infection)   . Anemia     with IV iron treatment  . Migraines   . History of kidney stones   . GERD (gastroesophageal reflux disease)   . Menopausal state     not on hormones  . Vitamin D deficiency   . Chronic back pain   . Dizziness     chronic, of ? etiology- with 1 ENT and 2 neuro work ups  . Iron deficiency     dx'd from bone marrow biopsy  . High blood pressure     Readings  . Hyperlipidemia     triglycerides; likely familial   Past Surgical  History  Procedure Date  . Cholecystectomy 2006  . Breast biopsy 2005  . Tonsillectomy 1989  . Gyn surgery 2000    hysterectomy- total, for adhesions/ endometriosis  . Cardiac catheterization 2010    multivessel CAD, treated medically  . Bone marrow biopsy 08/2010    normal with low iron stores   History  Substance Use Topics  . Smoking status: Former Smoker    Quit date: 06/26/2009  . Smokeless tobacco: Never Used   Comment: Quit   . Alcohol Use: No   Family History  Problem Relation Age of Onset  . Diabetes Mother   . Hyperlipidemia Mother   . Coronary artery disease Mother     hx of CABG in 75s  . Heart disease Mother     MI age 72, 4 vessel CABG  . Diabetes Father   . Hyperlipidemia Father   . Cancer Neg Hx   . Depression Neg Hx   . Anxiety disorder Neg Hx   . Heart disease Other  Grandparent  . Hypertension Other     Parent, grandparent, other family member  . Diabetes Other     Grandparent  . Kidney disease Other     Grandparent   Allergies  Allergen Reactions  . Byetta     Not effective   . Ciprofloxacin   . Erythromycin   . Oxycodone-Acetaminophen   . Sulfonamide Derivatives   . Sumatriptan    Current Outpatient Prescriptions on File Prior to Visit  Medication Sig Dispense Refill  . albuterol (PROAIR HFA) 108 (90 BASE) MCG/ACT inhaler 2 puffs up to every 4 hours as needed for wheezing       . aspirin 81 MG tablet Take 2 tablets by mouth at bedtime       . gabapentin (NEURONTIN) 600 MG tablet Take 1,200 mg by mouth 3 (three) times daily.        . metFORMIN (GLUCOPHAGE) 1000 MG tablet Take 1,000 mg by mouth 2 (two) times daily with a meal.       . metoprolol tartrate (LOPRESSOR) 25 MG tablet TAKE ONE TABLET BY MOUTH TWICE DAILY  180 tablet  3  . NIASPAN 750 MG CR tablet TAKE TWO TABLETS BY MOUTH AT BEDTIME  60 each  6  . nitroGLYCERIN (NITROSTAT) 0.4 MG SL tablet Use as needed       . Omega-3 Fatty Acids (FISH OIL) 1000 MG CAPS Take one by mouth 2  times a day       . ranolazine (RANEXA) 1000 MG SR tablet Take 1,000 mg by mouth 2 (two) times daily.       . rosuvastatin (CRESTOR) 40 MG tablet Take 40 mg by mouth daily.        Marland Kitchen spironolactone (ALDACTONE) 25 MG tablet Take 1 tablet (25 mg total) by mouth daily.  60 tablet  11  . calcium carbonate (TUMS) 500 MG chewable tablet Take as needed       . omeprazole-sodium bicarbonate (ZEGERID) 40-1100 MG per capsule Take 1 capsule by mouth daily.        Marland Kitchen tiotropium (SPIRIVA HANDIHALER) 18 MCG inhalation capsule Place 18 mcg into inhaler and inhale daily.               Review of Systems Review of Systems  Constitutional: Negative for fever, appetite change, fatigue and unexpected weight change.  Eyes: Negative for pain and visual disturbance.  Respiratory: Negative for cough and shortness of breath.   Cardiovascular: Negative for cp or palpitations  Pos for edema - helped by spironolactone  Gastrointestinal: Negative for nausea, diarrhea and constipation.  Genitourinary: Negative for urgency and frequency.pos for thirst with high sugar   Skin: Negative for pallor or rash   Neurological: Negative for weakness, light-headedness, numbness and headaches.  Hematological: Negative for adenopathy. Does not bruise/bleed easily.  Psychiatric/Behavioral: pos for depression and anxiety-medicated - stable overall with stress, no SI        Objective:   Physical Exam  Constitutional: She appears well-developed and well-nourished. No distress.       overwt and well appearing   HENT:  Head: Normocephalic and atraumatic.  Mouth/Throat: Oropharynx is clear and moist.  Eyes: Conjunctivae and EOM are normal. No scleral icterus.  Neck: Normal range of motion. Neck supple. No JVD present. Carotid bruit is not present. No thyromegaly present.  Cardiovascular: Normal rate, regular rhythm, normal heart sounds and intact distal pulses.   Pulmonary/Chest: Effort normal and breath sounds normal. No  respiratory distress. She has  no wheezes.       Diffusely distant bs   Abdominal: Soft. Bowel sounds are normal. She exhibits no distension and no mass. There is no tenderness.       No renal bruits   Musculoskeletal: She exhibits no tenderness.       Trace pedal edema   Lymphadenopathy:    She has no cervical adenopathy.  Neurological: She is alert. She has normal reflexes. A sensory deficit is present. No cranial nerve deficit. She exhibits normal muscle tone. Coordination normal.  Skin: Skin is warm and dry. No rash noted. No erythema. No pallor.  Psychiatric: She has a normal mood and affect.          Assessment & Plan:

## 2011-05-11 NOTE — Assessment & Plan Note (Signed)
Reviewed recent hospital records that were availible - (just H and P) Per pt - r/o for MI by enzymes with stable echo Feels fine now- and feels that cp and sob were stress related Has upcoming f/u with cardiol for multi vessel CAD that is medically treated  Again commended on quitting smoking

## 2011-06-12 ENCOUNTER — Telehealth: Payer: Self-pay | Admitting: *Deleted

## 2011-06-12 NOTE — Telephone Encounter (Signed)
Pt states that for the last 3 or 4 days she has had spells of feeling very cold, panicky and shaky about 30 seconds after rising from a sitting position.  Feels like she's spinning, feels like she is going to pass out.  Her blood sugars have been running from 270- 400.  She hasn't checked her blood pressure.  This only happens after she has been sitting and then she stands up.  Please advise on what you think she should do.  Says she doesn't have the money for her copay here, advised she could be billed.  Should she go to ER?

## 2011-06-12 NOTE — Telephone Encounter (Signed)
Since it is postural - it makes me wonder if her bp is low  , I would ordinarily refer her to sat clinic but I don't know what to do about the copay  (and I am checking this message at 4:57 pm) One good option may be to have someone take her to the fire station to check her bp  Go to ER if worse of course  She will need to f/u when able

## 2011-06-15 NOTE — Telephone Encounter (Signed)
Patient notified as instructed by telephone. Pt already has appt with Dr Milinda Antis 07/13/11 and pt will call back if she can work sooner appt into her schedule.

## 2011-07-06 ENCOUNTER — Other Ambulatory Visit: Payer: Managed Care, Other (non HMO)

## 2011-07-13 ENCOUNTER — Encounter: Payer: Self-pay | Admitting: Family Medicine

## 2011-07-13 ENCOUNTER — Ambulatory Visit (INDEPENDENT_AMBULATORY_CARE_PROVIDER_SITE_OTHER): Payer: Managed Care, Other (non HMO) | Admitting: Family Medicine

## 2011-07-13 VITALS — BP 116/70 | HR 88 | Temp 97.9°F | Ht 65.0 in | Wt 194.7 lb

## 2011-07-13 DIAGNOSIS — E119 Type 2 diabetes mellitus without complications: Secondary | ICD-10-CM

## 2011-07-13 DIAGNOSIS — R439 Unspecified disturbances of smell and taste: Secondary | ICD-10-CM | POA: Insufficient documentation

## 2011-07-13 DIAGNOSIS — G5790 Unspecified mononeuropathy of unspecified lower limb: Secondary | ICD-10-CM

## 2011-07-13 DIAGNOSIS — E785 Hyperlipidemia, unspecified: Secondary | ICD-10-CM

## 2011-07-13 DIAGNOSIS — I10 Essential (primary) hypertension: Secondary | ICD-10-CM

## 2011-07-13 LAB — CBC WITH DIFFERENTIAL/PLATELET
Basophils Relative: 0.2 % (ref 0.0–3.0)
Eosinophils Absolute: 0.3 10*3/uL (ref 0.0–0.7)
Eosinophils Relative: 1.8 % (ref 0.0–5.0)
Hemoglobin: 12.4 g/dL (ref 12.0–15.0)
Lymphocytes Relative: 19.9 % (ref 12.0–46.0)
MCHC: 33.3 g/dL (ref 30.0–36.0)
MCV: 95 fl (ref 78.0–100.0)
Monocytes Absolute: 1 10*3/uL (ref 0.1–1.0)
Neutro Abs: 12.6 10*3/uL — ABNORMAL HIGH (ref 1.4–7.7)
Neutrophils Relative %: 72.3 % (ref 43.0–77.0)
RBC: 3.91 Mil/uL (ref 3.87–5.11)
WBC: 17.5 10*3/uL — ABNORMAL HIGH (ref 4.5–10.5)

## 2011-07-13 LAB — COMPREHENSIVE METABOLIC PANEL
ALT: 22 U/L (ref 0–35)
Albumin: 4.1 g/dL (ref 3.5–5.2)
Alkaline Phosphatase: 81 U/L (ref 39–117)
Potassium: 4.2 mEq/L (ref 3.5–5.1)
Sodium: 139 mEq/L (ref 135–145)
Total Bilirubin: 0.1 mg/dL — ABNORMAL LOW (ref 0.3–1.2)
Total Protein: 7.6 g/dL (ref 6.0–8.3)

## 2011-07-13 LAB — LDL CHOLESTEROL, DIRECT: Direct LDL: 58.1 mg/dL

## 2011-07-13 LAB — LIPID PANEL
HDL: 49.1 mg/dL (ref >39.00)
Total CHOL/HDL Ratio: 3
Triglycerides: 312 mg/dL — ABNORMAL HIGH (ref 0.0–149.0)

## 2011-07-13 MED ORDER — GABAPENTIN 600 MG PO TABS: 1200.0000 mg | ORAL_TABLET | Freq: Three times a day (TID) | ORAL | Status: DC

## 2011-07-13 MED ORDER — SPIRONOLACTONE 25 MG PO TABS: 25.0000 mg | ORAL_TABLET | Freq: Every day | ORAL | Status: DC

## 2011-07-13 NOTE — Patient Instructions (Signed)
Labs today  Your psychiatrist will have to get the lithium level - I cannot do that here  Will check other labs  We will do endocrine referral at check out  I will look into the work up for the "smell " issue  Follow up with me in about 3 months

## 2011-07-13 NOTE — Assessment & Plan Note (Addendum)
persistant "smell of exhaust" despite no one else smelling it ? If poss med side eff Her psychiatrist rec MRI brain if not imp ? If med or anxiety related or due to sugar ?  Will continue to follow

## 2011-07-13 NOTE — Assessment & Plan Note (Signed)
Unsuccessful with controlling this so far - now on victroza and metformin with fair diet  Ref to endocrine for help with this Rev low glycemic diet  Urged to check sugar bid a1c today

## 2011-07-13 NOTE — Progress Notes (Signed)
Subjective:    Patient ID: Anna Pena, female    DOB: [birth year], 45 y.o.   MRN: [id number]  HPI Here for f/u of several medical problems   Wt is down 5 lb with bmi of 32  On victroza now for DM- and it is helping - no side effects Also metformin  Sugars-- still pretty high - in 300s for the most part -- checks it once daily most of the time in evening (2 hours before or after a meal)- sometimes skips meals  Still gets shaky / dizzy spells - not low but high  Fair with the diet  No energy to exercise  Due for labs Lab Results  Component Value Date   HGBA1C 7.4* 04/15/2010  also has neuropathy- just as bad/ horrible , on a lot of gabapentin    bp is   116/70  Today No cp or palpitations or headaches or edema  No side effects to medicines   On aldactone/ lopressor   On crestor for lipids Lab Results  Component Value Date   CHOL 132 01/09/2011   CHOL 173 04/15/2010   CHOL 202* 01/06/2010   Lab Results  Component Value Date   HDL 39* 01/09/2011   HDL 47 04/15/2010   HDL 47 7/82/9562   Lab Results  Component Value Date   LDLCALC 32 01/09/2011   LDLCALC 78 04/15/2010   LDLCALC 81 01/06/2010   Lab Results  Component Value Date   TRIG 307* 01/09/2011   TRIG 238* 04/15/2010   TRIG 371* 01/06/2010   Lab Results  Component Value Date   CHOLHDL 3.4 01/09/2011   CHOLHDL 3.7 Ratio 04/15/2010   CHOLHDL 4.3 Ratio 01/06/2010   Lab Results  Component Value Date   LDLDIRECT 173.4 07/01/2009   LDLDIRECT 213.5 08/21/2008   due for check Has also seen cardiol   Psychiatrist put her on lithium - needs level done there  Also having problem with smelling exhaust--- carbon monoxide detector works fine  And also no gas in house  Her psychiatrist told her she may need mri No headache / nasal or other symptoms  Patient Active Problem List  Diagnoses  . UNSPECIFIED VITAMIN D DEFICIENCY  . HYPERLIPIDEMIA  . ANEMIA-NOS  . LEUKOCYTOSIS  . ANXIETY  . DEPRESSION  . MIGRAINE HEADACHE  .  PERIPHERAL NEUROPATHY, FEET  . HYPERTENSION, BENIGN  . CAD, NATIVE VESSEL  . GERD  . MELASMA  . BACK PAIN  . FATIGUE  . EDEMA  . TOBACCO USE, QUIT  . DIABETES-TYPE 2  . Smell disturbance   Past Medical History  Diagnosis Date  . Hypertension   . Anxiety and depression   . Diabetes mellitus   . CAD (coronary artery disease)     diffuse, multivessel, small vessel disease  . Pneumonia 2011    hospital acquired  . Heart murmur     no echo  . Hx: UTI (urinary tract infection)   . Anemia     with IV iron treatment  . Migraines   . History of kidney stones   . GERD (gastroesophageal reflux disease)   . Menopausal state     not on hormones  . Vitamin D deficiency   . Chronic back pain   . Dizziness     chronic, of ? etiology- with 1 ENT and 2 neuro work ups  . Iron deficiency     dx'd from bone marrow biopsy  . High blood pressure     Readings  .  Hyperlipidemia     triglycerides; likely familial   Past Surgical History  Procedure Date  . Cholecystectomy 2006  . Breast biopsy 2005  . Tonsillectomy 1989  . Gyn surgery 2000    hysterectomy- total, for adhesions/ endometriosis  . Cardiac catheterization 2010    multivessel CAD, treated medically  . Bone marrow biopsy 08/2010    normal with low iron stores   History  Substance Use Topics  . Smoking status: Former Smoker    Quit date: 06/26/2009  . Smokeless tobacco: Never Used   Comment: Quit   . Alcohol Use: No   Family History  Problem Relation Age of Onset  . Diabetes Mother   . Hyperlipidemia Mother   . Coronary artery disease Mother     hx of CABG in 60s  . Heart disease Mother     MI age 51, 4 vessel CABG  . Diabetes Father   . Hyperlipidemia Father   . Cancer Neg Hx   . Depression Neg Hx   . Anxiety disorder Neg Hx   . Heart disease Other     Grandparent  . Hypertension Other     Parent, grandparent, other family member  . Diabetes Other     Grandparent  . Kidney disease Other     Grandparent    Allergies  Allergen Reactions  . Byetta     Not effective   . Ciprofloxacin   . Erythromycin   . Oxycodone-Acetaminophen   . Sulfonamide Derivatives   . Sumatriptan    Current Outpatient Prescriptions on File Prior to Visit  Medication Sig Dispense Refill  . aspirin 81 MG tablet Take 2 tablets by mouth at bedtime       . Cholecalciferol (VITAMIN D) 2000 UNITS CAPS Take 1 capsule by mouth daily.        . IRON DEXTRAN-FOLIC ACID-B12 PO Take 1 tablet by mouth daily.        . Liraglutide (VICTOZA) 18 MG/3ML SOLN Inject 0.2 mLs (1.2 mg total) into the skin daily.  6 mL  11  . metFORMIN (GLUCOPHAGE) 1000 MG tablet Take 1,000 mg by mouth 2 (two) times daily with a meal.       . metoprolol tartrate (LOPRESSOR) 25 MG tablet TAKE ONE TABLET BY MOUTH TWICE DAILY  180 tablet  3  . NIASPAN 750 MG CR tablet TAKE TWO TABLETS BY MOUTH AT BEDTIME  60 each  6  . Omega-3 Fatty Acids (FISH OIL) 1000 MG CAPS Take one by mouth 2 times a day       . rosuvastatin (CRESTOR) 40 MG tablet Take 40 mg by mouth daily.        Marland Kitchen albuterol (PROAIR HFA) 108 (90 BASE) MCG/ACT inhaler 2 puffs up to every 4 hours as needed for wheezing       . calcium carbonate (TUMS) 500 MG chewable tablet Take as needed       . nitroGLYCERIN (NITROSTAT) 0.4 MG SL tablet Use as needed       . nortriptyline (PAMELOR) 75 MG capsule Take 75 mg by mouth at bedtime.        Marland Kitchen omeprazole-sodium bicarbonate (ZEGERID) 40-1100 MG per capsule Take 1 capsule by mouth daily.        . ranolazine (RANEXA) 1000 MG SR tablet Take 1,000 mg by mouth 2 (two) times daily.       Marland Kitchen tiotropium (SPIRIVA HANDIHALER) 18 MCG inhalation capsule Place 18 mcg into inhaler and inhale daily.  Review of Systems Review of Systems  Constitutional: Negative for fever, appetite change,  and unexpected weight change.  Eyes: Negative for pain and visual disturbance.  ENT neg for congestion or facial pain  Respiratory: Negative for cough and shortness of  breath.   Cardiovascular: Negative for cp or palpitations    Gastrointestinal: Negative for nausea, diarrhea and constipation.  Genitourinary: Negative for urgency and frequency.  Skin: Negative for pallor or rash   Neurological: Negative for weakness, light-headedness, numbness and headaches.  Hematological: Negative for adenopathy. Does not bruise/bleed easily.  Psychiatric/Behavioral:pos for anxiety and depression that are being treated         Objective:   Physical Exam  Constitutional: She appears well-developed and well-nourished. No distress.       overwt and well appearing   HENT:  Head: Normocephalic and atraumatic.  Right Ear: External ear normal.  Left Ear: External ear normal.  Nose: Nose normal.  Mouth/Throat: Oropharynx is clear and moist.  Eyes: Conjunctivae and EOM are normal. Pupils are equal, round, and reactive to light. No scleral icterus.  Neck: Normal range of motion. Neck supple. No JVD present. Carotid bruit is not present. No thyromegaly present.  Cardiovascular: Normal rate, regular rhythm, normal heart sounds and intact distal pulses.  Exam reveals no gallop.   Pulmonary/Chest: Effort normal and breath sounds normal. No respiratory distress. She has no wheezes.       Diffusely distant bs   Abdominal: Soft. Bowel sounds are normal. She exhibits no distension, no abdominal bruit and no mass. There is no tenderness.  Musculoskeletal: Normal range of motion. She exhibits no edema and no tenderness.  Lymphadenopathy:    She has no cervical adenopathy.  Neurological: She is alert. She has normal reflexes. She displays no tremor. No cranial nerve deficit. She exhibits normal muscle tone. Coordination normal.  Skin: Skin is warm and dry. No rash noted. No erythema. No pallor.  Psychiatric: Her behavior is normal.       Anxious today- but not as much so as previous visits           Assessment & Plan:

## 2011-07-13 NOTE — Assessment & Plan Note (Signed)
On crestor-lab today Tolerating it ok  Rev sat fat diet and last lab with pt

## 2011-07-13 NOTE — Assessment & Plan Note (Signed)
bp in fair control at this time  No changes needed  Disc lifstyle change with low sodium diet and exercise   

## 2011-07-13 NOTE — Assessment & Plan Note (Signed)
Hope for improvement when sugar gets under control Refilled gabapentin

## 2011-08-28 ENCOUNTER — Other Ambulatory Visit: Payer: Self-pay | Admitting: Cardiovascular Disease

## 2011-08-28 NOTE — Telephone Encounter (Signed)
Refill sent for crestor.  

## 2011-08-31 ENCOUNTER — Ambulatory Visit (INDEPENDENT_AMBULATORY_CARE_PROVIDER_SITE_OTHER): Payer: Managed Care, Other (non HMO) | Admitting: Family Medicine

## 2011-08-31 ENCOUNTER — Telehealth: Payer: Self-pay | Admitting: Family Medicine

## 2011-08-31 ENCOUNTER — Encounter: Payer: Self-pay | Admitting: Family Medicine

## 2011-08-31 VITALS — BP 118/76 | HR 96 | Temp 98.3°F | Ht 65.0 in | Wt 188.5 lb

## 2011-08-31 DIAGNOSIS — R112 Nausea with vomiting, unspecified: Secondary | ICD-10-CM | POA: Insufficient documentation

## 2011-08-31 MED ORDER — PROMETHAZINE HCL 25 MG/ML IJ SOLN
25.0000 mg | Freq: Once | INTRAMUSCULAR | Status: AC
  Administered 2011-08-31: 25 mg via INTRAMUSCULAR

## 2011-08-31 MED ORDER — PROMETHAZINE HCL 25 MG RE SUPP: 25.0000 mg | Freq: Four times a day (QID) | RECTAL | Status: DC | PRN

## 2011-08-31 MED ORDER — PROMETHAZINE HCL 25 MG/ML IJ SOLN: 25.0000 mg | Freq: Once | INTRAMUSCULAR | Status: DC

## 2011-08-31 NOTE — Telephone Encounter (Signed)
Triage Record Num: [id number] Operator: Jeraldine Loots Patient Name: Anna Pena Call Date & Time: 08/31/2011 2:14:24PM Patient Phone: [phone] PCP: Idamae Schuller A. Tower Patient Gender: Female PCP Fax : Patient DOB: Feb 25, 1966 Practice Name: [facility] Reason for Call: Caller: Anna Pena/Patient; PCP: Roxy Manns A.; CB#: [phone]; Has had vomiting and nausea for a month. Was seen by endocrinologist and started on Lantus insulin 15u daily. Was already on Metformin 1000mg  bid and Victoza 1.2mg  inj daily . Asking if she should be on all of this medication. Has not called the endocrinologist. Her bs are 150-250mg  but this is not fasting. States that she was not told to check FBS. Has an appt. with Dr. Milinda Antis in March. Needs to be seen today with nausea and vomiting issue. No appt. available. Transferred to [facility] for appt. Protocol(s) Used: Diabetes: Gastrointestinal Problems Recommended Outcome per Protocol: See ED Immediately Reason for Outcome: Vomiting multiple times OR unable to keep fluids down for more than 2 hours Care Advice: ~ IMMEDIATE ACTION 08/31/2011 2:25:24PM Page 1 of 1 [email or web address]

## 2011-08-31 NOTE — Assessment & Plan Note (Signed)
Differential incl viral gastroenteritis (with diarrhea), gastroparesis DM, or less likey lithium tox Bmp and lithium level today  Phenergan 25 mg IM Then phenergan supp prn  Disc sips of fluids to gradually rehydrate  Then adv bland diet slowly Pt will contact endo also  Update if not starting to improve in a week or if worsening  (disc red flags for dehydration also)

## 2011-08-31 NOTE — Telephone Encounter (Signed)
I saw pt in office.

## 2011-08-31 NOTE — Progress Notes (Signed)
Subjective:    Patient ID: Anna Pena, female    DOB: Sep 03, 1965, 46 y.o.   MRN: [id number]  HPI Started having n/v about 4 d ago ( up and down for 2 weeks)   Wonders if DM med is causing it  Sugar 240-280-- is slowly coming down  Sees endo now  No longer on byetta or victoza Recently increased her insulin   No chance she pregnant  Acid reflux has not been great  Recently increased insulin  ? Has gastroparesis  On and off some diarrhea- not consistent  No blood in stool  abd pain -- not really - more like hunger pains  ? Fever- some chills    Called endo- waiting on a call back     Stomach makes lots of noise  Is getting headache- likely from dehydration     Remeron, seroquel and lithium -- not new , no change in doses   No meds today due to not being able to hold anything down   grandaughter has a stomach ache at home   Patient Active Problem List  Diagnoses  . UNSPECIFIED VITAMIN D DEFICIENCY  . HYPERLIPIDEMIA  . ANEMIA-NOS  . LEUKOCYTOSIS  . ANXIETY  . DEPRESSION  . MIGRAINE HEADACHE  . PERIPHERAL NEUROPATHY, FEET  . HYPERTENSION, BENIGN  . CAD, NATIVE VESSEL  . GERD  . MELASMA  . BACK PAIN  . FATIGUE  . EDEMA  . TOBACCO USE, QUIT  . DIABETES-TYPE 2  . Smell disturbance  . Nausea & vomiting   Past Medical History  Diagnosis Date  . Hypertension   . Anxiety and depression   . Diabetes mellitus   . CAD (coronary artery disease)     diffuse, multivessel, small vessel disease  . Pneumonia 2011    hospital acquired  . Heart murmur     no echo  . Hx: UTI (urinary tract infection)   . Anemia     with IV iron treatment  . Migraines   . History of kidney stones   . GERD (gastroesophageal reflux disease)   . Menopausal state     not on hormones  . Vitamin d deficiency   . Chronic back pain   . Dizziness     chronic, of ? etiology- with 1 ENT and 2 neuro work ups  . Iron deficiency     dx'd from bone marrow biopsy  . High blood pressure       Readings  . Hyperlipidemia     triglycerides; likely familial   Past Surgical History  Procedure Date  . Cholecystectomy 2006  . Breast biopsy 2005  . Tonsillectomy 1989  . Gyn surgery 2000    hysterectomy- total, for adhesions/ endometriosis  . Cardiac catheterization 2010    multivessel CAD, treated medically  . Bone marrow biopsy 08/2010    normal with low iron stores   History  Substance Use Topics  . Smoking status: Former Smoker    Quit date: 06/26/2009  . Smokeless tobacco: Never Used   Comment: Quit   . Alcohol Use: No   Family History  Problem Relation Age of Onset  . Diabetes Mother   . Hyperlipidemia Mother   . Coronary artery disease Mother     hx of CABG in 14s  . Heart disease Mother     MI age 94, 4 vessel CABG  . Diabetes Father   . Hyperlipidemia Father   . Cancer Neg Hx   . Depression Neg Hx   .  Anxiety disorder Neg Hx   . Heart disease Other     Grandparent  . Hypertension Other     Parent, grandparent, other family member  . Diabetes Other     Grandparent  . Kidney disease Other     Grandparent   Allergies  Allergen Reactions  . Byetta     Not effective   . Ciprofloxacin   . Erythromycin   . Sulfonamide Derivatives   . Sumatriptan    Current Outpatient Prescriptions on File Prior to Visit  Medication Sig Dispense Refill  . aspirin 81 MG tablet Take 2 tablets by mouth at bedtime       . CRESTOR 40 MG tablet TAKE ONE TABLET BY MOUTH EVERY DAY  30 each  6  . gabapentin (NEURONTIN) 600 MG tablet Take 2 tablets (1,200 mg total) by mouth 3 (three) times daily.  180 tablet  11  . Liraglutide (VICTOZA) 18 MG/3ML SOLN Inject 0.2 mLs (1.2 mg total) into the skin daily.  6 mL  11  . metFORMIN (GLUCOPHAGE) 1000 MG tablet Take 1,000 mg by mouth 2 (two) times daily with a meal.       . metoprolol tartrate (LOPRESSOR) 25 MG tablet TAKE ONE TABLET BY MOUTH TWICE DAILY  180 tablet  3  . mirtazapine (REMERON) 45 MG tablet Take 1 tablet by mouth at  bedtime.      Marland Kitchen NIASPAN 750 MG CR tablet TAKE TWO TABLETS BY MOUTH AT BEDTIME  60 each  6  . nortriptyline (PAMELOR) 75 MG capsule Take 75 mg by mouth at bedtime.        . Omega-3 Fatty Acids (FISH OIL) 1000 MG CAPS Take one by mouth 2 times a day       . QUEtiapine (SEROQUEL) 400 MG tablet Take 400 mg by mouth at bedtime.        Marland Kitchen tiotropium (SPIRIVA HANDIHALER) 18 MCG inhalation capsule Place 18 mcg into inhaler and inhale daily.        Marland Kitchen albuterol (PROAIR HFA) 108 (90 BASE) MCG/ACT inhaler 2 puffs up to every 4 hours as needed for wheezing       . calcium carbonate (TUMS) 500 MG chewable tablet Take as needed       . Cholecalciferol (VITAMIN D) 2000 UNITS CAPS Take 1 capsule by mouth daily.        . IRON DEXTRAN-FOLIC ACID-B12 PO Take 1 tablet by mouth daily.        . nitroGLYCERIN (NITROSTAT) 0.4 MG SL tablet Use as needed       . omeprazole-sodium bicarbonate (ZEGERID) 40-1100 MG per capsule Take 1 capsule by mouth daily.        . ranolazine (RANEXA) 1000 MG SR tablet Take 1,000 mg by mouth 2 (two) times daily.       Marland Kitchen spironolactone (ALDACTONE) 25 MG tablet Take 1 tablet (25 mg total) by mouth daily.  90 tablet  3   No current facility-administered medications on file prior to visit.          Review of Systems Review of Systems  Constitutional: Negative for fever, appetite change,  and unexpected weight change. pos for fatigue Eyes: Negative for pain and visual disturbance.  Respiratory: Negative for cough and shortness of breath.   Cardiovascular: Negative for cp or palpitations    Gastrointestinal: Negative for abd pain/ blood in stool/ constipation.  Genitourinary: Negative for urgency and frequency.  Skin: Negative for pallor or rash   Neurological:  Negative for weakness, light-headedness, numbness and headaches.  Hematological: Negative for adenopathy. Does not bruise/bleed easily.  Psychiatric/Behavioral:pos for dep and anxiety without SI.          Objective:    Physical Exam  Constitutional: She appears well-developed and well-nourished. No distress.       overwt and well appearing   HENT:  Head: Normocephalic and atraumatic.  Mouth/Throat: Oropharynx is clear and moist.  Eyes: Conjunctivae and EOM are normal. Pupils are equal, round, and reactive to light. No scleral icterus.  Neck: Normal range of motion. Neck supple. No JVD present. No tracheal deviation present.  Cardiovascular: Normal rate, regular rhythm and normal heart sounds.  Exam reveals no gallop.   Pulmonary/Chest: Effort normal and breath sounds normal. No respiratory distress. She has no wheezes.  Abdominal: Soft. Bowel sounds are normal. She exhibits no distension and no mass. There is no tenderness. There is no rebound and no guarding.  Musculoskeletal: Normal range of motion. She exhibits no edema.  Lymphadenopathy:    She has no cervical adenopathy.  Neurological: She is alert. She has normal reflexes. She exhibits normal muscle tone. Coordination normal.  Skin: Skin is warm and dry. No rash noted. No erythema. No pallor.       Brisk cap refil No tenting  Psychiatric: Her behavior is normal. Judgment and thought content normal.       Is mildly anxious today Good eye contact and comm skills          Assessment & Plan:

## 2011-08-31 NOTE — Patient Instructions (Signed)
Phenergan shot today  Then suppository as needed  Sip water / fluids / or ice  If symptoms of dehydration- go to ER If worse / abd pain- go to ER When improved -- BRAT diet - slowly advance  Still get in touch with your endocrinologist  Update if not starting to improve in a week or if worsening   Labs - with lithium level today

## 2011-09-01 LAB — BASIC METABOLIC PANEL
Calcium: 10.5 mg/dL (ref 8.4–10.5)
GFR: 59.71 mL/min — ABNORMAL LOW (ref >60.00)
Glucose, Bld: 156 mg/dL — ABNORMAL HIGH (ref 70–99)
Potassium: 3 mEq/L — ABNORMAL LOW (ref 3.5–5.1)
Sodium: 138 mEq/L (ref 135–145)

## 2011-09-01 LAB — LITHIUM LEVEL: Lithium Lvl: 0.45 mEq/L — ABNORMAL LOW (ref 0.80–1.40)

## 2011-09-03 ENCOUNTER — Other Ambulatory Visit: Payer: Self-pay | Admitting: Family Medicine

## 2011-09-03 ENCOUNTER — Telehealth: Payer: Self-pay | Admitting: Family Medicine

## 2011-09-03 DIAGNOSIS — E876 Hypokalemia: Secondary | ICD-10-CM

## 2011-09-03 MED ORDER — POTASSIUM CHLORIDE ER 10 MEQ PO TBCR: 10.0000 meq | EXTENDED_RELEASE_TABLET | Freq: Two times a day (BID) | ORAL | Status: DC

## 2011-09-03 NOTE — Telephone Encounter (Signed)
Patient notified as instructed by telephone. Medication phoned to [facility] [address] pharmacy as instructed. Pt did not make lab appt in 2 wks;pt said she cannot afford a lab copay and office copay.Pt already scheduled to see Dr Milinda Antis 10/12/11 and pt wanted to know if could do lab at that time. Pt said vomiting is better. She has vomited x 1 this AM. On 08/02/11 diarrhea started and pt said has diarrhea two times an hour since yesterday. (pt not taking any med for diarrhea) Pt said having lower abdominal sharp pain that goes thru to her back. Pt said has no fever. Pt has appt to see endo Dr Harold Hedge this morning. Please advise.

## 2011-09-03 NOTE — Telephone Encounter (Signed)
K is low from vomiting likely  Please send in px for K  Re check K in 2 weeks  Let me know how symptoms are, and if she got in touch with endo- if they had any more suggestions thanks

## 2011-09-03 NOTE — Telephone Encounter (Signed)
This abd pain is new - may be assoc with diarrhea , but if it continues or worsens she needs to be seen somewhere she can afford to go... Perhaps she can ask Dr Judie Petit to check her abd this am The K much be checked- as low K can cause heart problems.... So I need to check it in 2 weeks to see when it is ok to stop the med etc .... Please link her up to [facility] to see if payment plan is available for that lab draw or perhaps she can find out if it is cheaper at a drawing station at [facility] etc.  (bottom line is -- very important)

## 2011-09-03 NOTE — Telephone Encounter (Signed)
Patient notified as instructed by telephone. Lab appt scheduled as instructed 09/21/11 at 10:30am. Pt said her health was most important so she just scheduled lab appt. Pt said she would get Dr Harold Hedge to ck her abd pain and diarrhea this AM.Pt advised to call back if condition worsened or symptoms changed.

## 2011-09-10 ENCOUNTER — Telehealth: Payer: Self-pay

## 2011-09-10 NOTE — Telephone Encounter (Signed)
Amy with [facility] left v/m to ck status of record request for pts social security disablity records sent on 08/12/11. Asher Muir in front office said received on 08/21/11 and was sent to [facility] 08/25/11. To ck on status Amy can call [phone]. I called Amy but spoke with Rayna Sexton and gave above information.

## 2011-09-16 ENCOUNTER — Inpatient Hospital Stay: Payer: Self-pay | Admitting: *Deleted

## 2011-09-16 LAB — COMPREHENSIVE METABOLIC PANEL
Albumin: 3.9 g/dL (ref 3.4–5.0)
Alkaline Phosphatase: 83 U/L (ref 50–136)
Anion Gap: 23 — ABNORMAL HIGH (ref 7–16)
Bilirubin,Total: 0.3 mg/dL (ref 0.2–1.0)
Calcium, Total: 9.4 mg/dL (ref 8.5–10.1)
Chloride: 95 mmol/L — ABNORMAL LOW (ref 98–107)
Co2: 15 mmol/L — ABNORMAL LOW (ref 21–32)
Osmolality: 280 (ref 275–301)
Potassium: 2.6 mmol/L — ABNORMAL LOW (ref 3.5–5.1)
SGOT(AST): 22 U/L (ref 15–37)

## 2011-09-16 LAB — URINALYSIS, COMPLETE
Bilirubin,UR: NEGATIVE
Nitrite: NEGATIVE
Ph: 7 (ref 4.5–8.0)
Protein: 30
RBC,UR: 1 /HPF (ref 0–5)
Specific Gravity: 1.008 (ref 1.003–1.030)
WBC UR: 1 /HPF (ref 0–5)

## 2011-09-16 LAB — CBC
HCT: 36.3 % (ref 35.0–47.0)
MCHC: 33.9 g/dL (ref 32.0–36.0)
MCV: 92 fL (ref 80–100)
RDW: 13.8 % (ref 11.5–14.5)
WBC: 14.4 10*3/uL — ABNORMAL HIGH (ref 3.6–11.0)

## 2011-09-16 LAB — BASIC METABOLIC PANEL
Anion Gap: 15 (ref 7–16)
Calcium, Total: 8.6 mg/dL (ref 8.5–10.1)
Chloride: 103 mmol/L (ref 98–107)
Glucose: 208 mg/dL — ABNORMAL HIGH (ref 65–99)
Osmolality: 280 (ref 275–301)
Potassium: 2.9 mmol/L — ABNORMAL LOW (ref 3.5–5.1)

## 2011-09-16 LAB — CK TOTAL AND CKMB (NOT AT ARMC)
CK, Total: 73 U/L (ref 21–215)
CK-MB: 1 ng/mL (ref 0.5–3.6)

## 2011-09-16 LAB — LITHIUM LEVEL: Lithium: 0.27 mmol/L — ABNORMAL LOW

## 2011-09-17 LAB — BASIC METABOLIC PANEL
BUN: 3 mg/dL — ABNORMAL LOW (ref 7–18)
Calcium, Total: 7.9 mg/dL — ABNORMAL LOW (ref 8.5–10.1)
Chloride: 108 mmol/L — ABNORMAL HIGH (ref 98–107)
Co2: 21 mmol/L (ref 21–32)
Creatinine: 0.96 mg/dL (ref 0.60–1.30)
Potassium: 3.4 mmol/L — ABNORMAL LOW (ref 3.5–5.1)
Sodium: 143 mmol/L (ref 136–145)

## 2011-09-17 LAB — CBC WITH DIFFERENTIAL/PLATELET
Basophil %: 0.4 %
Eosinophil #: 0.1 10*3/uL (ref 0.0–0.7)
Eosinophil %: 1.4 %
HCT: 30.6 % — ABNORMAL LOW (ref 35.0–47.0)
HGB: 10.3 g/dL — ABNORMAL LOW (ref 12.0–16.0)
Lymphocyte %: 34.5 %
MCHC: 33.6 g/dL (ref 32.0–36.0)
Monocyte %: 6.2 %
Neutrophil #: 5.9 10*3/uL (ref 1.4–6.5)
Neutrophil %: 57.5 %
RBC: 3.3 10*6/uL — ABNORMAL LOW (ref 3.80–5.20)
RDW: 13.7 % (ref 11.5–14.5)

## 2011-09-17 LAB — LIPID PANEL
HDL Cholesterol: 35 mg/dL — ABNORMAL LOW (ref 40–60)
Triglycerides: 475 mg/dL — ABNORMAL HIGH (ref 0–200)

## 2011-09-17 LAB — CLOSTRIDIUM DIFFICILE BY PCR

## 2011-09-17 LAB — MAGNESIUM: Magnesium: 1.6 mg/dL — ABNORMAL LOW

## 2011-09-17 LAB — TSH: Thyroid Stimulating Horm: 0.767 u[IU]/mL

## 2011-09-17 LAB — HEMOGLOBIN A1C: Hemoglobin A1C: 9.6 % — ABNORMAL HIGH (ref 4.2–6.3)

## 2011-09-18 LAB — BASIC METABOLIC PANEL
Anion Gap: 12 (ref 7–16)
BUN: 6 mg/dL — ABNORMAL LOW (ref 7–18)
Calcium, Total: 8.2 mg/dL — ABNORMAL LOW (ref 8.5–10.1)
Chloride: 113 mmol/L — ABNORMAL HIGH (ref 98–107)
Co2: 22 mmol/L (ref 21–32)
Creatinine: 0.96 mg/dL (ref 0.60–1.30)
EGFR (African American): >60
EGFR (Non-African Amer.): >60
Osmolality: 296 (ref 275–301)
Potassium: 3.5 mmol/L (ref 3.5–5.1)
Sodium: 147 mmol/L — ABNORMAL HIGH (ref 136–145)

## 2011-09-18 LAB — CBC WITH DIFFERENTIAL/PLATELET
Basophil %: 0.4 %
Eosinophil #: 0.2 10*3/uL (ref 0.0–0.7)
Eosinophil %: 2.2 %
HGB: 10.9 g/dL — ABNORMAL LOW (ref 12.0–16.0)
Lymphocyte %: 34.8 %
MCHC: 33.1 g/dL (ref 32.0–36.0)
Monocyte #: 0.5 10*3/uL (ref 0.0–0.7)
Neutrophil #: 5 10*3/uL (ref 1.4–6.5)
Neutrophil %: 56.5 %
Platelet: 442 10*3/uL — ABNORMAL HIGH (ref 150–440)

## 2011-09-19 LAB — BASIC METABOLIC PANEL
Anion Gap: 9 (ref 7–16)
Calcium, Total: 8.5 mg/dL (ref 8.5–10.1)
Chloride: 108 mmol/L — ABNORMAL HIGH (ref 98–107)
Co2: 27 mmol/L (ref 21–32)
EGFR (African American): >60
EGFR (Non-African Amer.): >60
Glucose: 182 mg/dL — ABNORMAL HIGH (ref 65–99)
Osmolality: 289 (ref 275–301)

## 2011-09-19 LAB — MAGNESIUM: Magnesium: 1.9 mg/dL

## 2011-09-21 ENCOUNTER — Other Ambulatory Visit: Payer: Managed Care, Other (non HMO)

## 2011-10-12 ENCOUNTER — Ambulatory Visit: Payer: Managed Care, Other (non HMO) | Admitting: Family Medicine

## 2011-10-19 ENCOUNTER — Ambulatory Visit: Payer: Managed Care, Other (non HMO) | Admitting: Family Medicine

## 2011-11-09 ENCOUNTER — Other Ambulatory Visit: Payer: Self-pay | Admitting: Cardiovascular Disease

## 2011-11-27 ENCOUNTER — Ambulatory Visit: Payer: Managed Care, Other (non HMO) | Admitting: Family Medicine

## 2011-11-27 DIAGNOSIS — Z0289 Encounter for other administrative examinations: Secondary | ICD-10-CM

## 2011-12-14 ENCOUNTER — Ambulatory Visit: Payer: Managed Care, Other (non HMO) | Admitting: Family Medicine

## 2011-12-14 ENCOUNTER — Telehealth: Payer: Self-pay | Admitting: Family Medicine

## 2011-12-14 NOTE — Telephone Encounter (Signed)
Hysterctomy 1991//Caller: Homer/Patient; PCP: Roxy Manns A.; CB#: [phone];  Call regarding shaking on L side of body and she couldn't speak or move and then started with severe headache on L side/ Constant-onset 12/10/11. Hx Migraines seen in [facility] ER on 12/11/11 for Numbness on L side- Dx with TIA vs Complex Migraine. Will be going to have EEG on 12/23/11 @ [facility] and scheduled to see Neurologist on 01/31/12. Wbc elevated- dx with kidney/Urinary tract infection. Given IV fluids and started on Keflex. Senstivity to light and trouble focusing and numbness in L hand. BP=140/76 blood glucose 142. Daughter gave 2 Tramadol last night and helped some. Triage and Care advice per Headache Protocol and appnt scheduled for 12/14/11 @ 1600 with Dr. Milinda Antis

## 2011-12-15 ENCOUNTER — Encounter: Payer: Self-pay | Admitting: Family Medicine

## 2011-12-15 ENCOUNTER — Ambulatory Visit (INDEPENDENT_AMBULATORY_CARE_PROVIDER_SITE_OTHER): Payer: Managed Care, Other (non HMO) | Admitting: Family Medicine

## 2011-12-15 VITALS — BP 112/70 | HR 76 | Temp 98.1°F | Ht 65.0 in | Wt 198.8 lb

## 2011-12-15 DIAGNOSIS — R5383 Other fatigue: Secondary | ICD-10-CM

## 2011-12-15 DIAGNOSIS — R609 Edema, unspecified: Secondary | ICD-10-CM

## 2011-12-15 DIAGNOSIS — R112 Nausea with vomiting, unspecified: Secondary | ICD-10-CM

## 2011-12-15 DIAGNOSIS — R531 Weakness: Secondary | ICD-10-CM | POA: Insufficient documentation

## 2011-12-15 DIAGNOSIS — G43909 Migraine, unspecified, not intractable, without status migrainosus: Secondary | ICD-10-CM

## 2011-12-15 DIAGNOSIS — N390 Urinary tract infection, site not specified: Secondary | ICD-10-CM

## 2011-12-15 DIAGNOSIS — F17200 Nicotine dependence, unspecified, uncomplicated: Secondary | ICD-10-CM

## 2011-12-15 LAB — COMPREHENSIVE METABOLIC PANEL
ALT: 14 U/L (ref 0–35)
AST: 12 U/L (ref 0–37)
Albumin: 3.9 g/dL (ref 3.5–5.2)
Alkaline Phosphatase: 59 U/L (ref 39–117)
BUN: 11 mg/dL (ref 6–23)
Calcium: 8.7 mg/dL (ref 8.4–10.5)
Chloride: 109 mEq/L (ref 96–112)
Potassium: 4.4 mEq/L (ref 3.5–5.1)

## 2011-12-15 LAB — CBC WITH DIFFERENTIAL/PLATELET
Basophils Relative: 0.3 % (ref 0.0–3.0)
Eosinophils Relative: 1.2 % (ref 0.0–5.0)
Hemoglobin: 13.1 g/dL (ref 12.0–15.0)
Lymphocytes Relative: 33.3 % (ref 12.0–46.0)
MCV: 93 fl (ref 78.0–100.0)
Neutrophils Relative %: 59.4 % (ref 43.0–77.0)
RBC: 4.2 Mil/uL (ref 3.87–5.11)
WBC: 15.9 10*3/uL — ABNORMAL HIGH (ref 4.5–10.5)

## 2011-12-15 MED ORDER — PROMETHAZINE HCL 50 MG/ML IJ SOLN
50.0000 mg | Freq: Once | INTRAMUSCULAR | Status: AC
  Administered 2011-12-15: 50 mg via INTRAMUSCULAR

## 2011-12-15 MED ORDER — TRAMADOL HCL 50 MG PO TABS: 50.0000 mg | ORAL_TABLET | Freq: Three times a day (TID) | ORAL | Status: AC | PRN

## 2011-12-15 NOTE — Progress Notes (Signed)
Subjective:    Patient ID: Anna Pena, female    DOB: [birth year], 46 y.o.   MRN: [id number]  HPI Here for f/u from hosp at [facility]   Pt was admitted with LUE numbness/ weakness/ dizzy/ and shaking episode of LUE (believed not to be seizure) Seen by neuro Also had uti   CT head nl  MRI ordered with MRA- came back ok per pt   Headache really came on 1/2 way through the hosp    Final dx was complex migraine vs TIA  appt with neurol is June 7th - Dr Alverda Skeans   Was smoking again   Wbc high - baseline and came down to 10.4 at d/c  Cr 1.6- came down to 1.28 at d/c after tx uti  tx uti with keflex- few more days of that    Now has continued headache - and also edema  Is from back of head all the way up to the front Is constant- not throbbing  Not worse with exertion  Really tired  Taking 800 mg ibuprofen -no help  Daughter gave her a pain pill  Is nauseated with headache   Is back to smoking about 1 ppd   Is still seeing psychiatry for stressors and anxiety  Filed for bankrupcy Not abused in any way   Patient Active Problem List  Diagnoses  . UNSPECIFIED VITAMIN D DEFICIENCY  . HYPERLIPIDEMIA  . ANEMIA-NOS  . LEUKOCYTOSIS  . ANXIETY  . DEPRESSION  . MIGRAINE HEADACHE  . PERIPHERAL NEUROPATHY, FEET  . HYPERTENSION, BENIGN  . CAD, NATIVE VESSEL  . GERD  . MELASMA  . BACK PAIN  . FATIGUE  . EDEMA  . Smoker  . DIABETES-TYPE 2  . Smell disturbance  . Nausea & vomiting  . UTI (lower urinary tract infection)  . Weakness   Past Medical History  Diagnosis Date  . Hypertension   . Anxiety and depression   . Diabetes mellitus   . CAD (coronary artery disease)     diffuse, multivessel, small vessel disease  . Pneumonia 2011    hospital acquired  . Heart murmur     no echo  . Hx: UTI (urinary tract infection)   . Anemia     with IV iron treatment  . Migraines   . History of kidney stones   . GERD (gastroesophageal reflux disease)   . Menopausal state      not on hormones  . Vitamin d deficiency   . Chronic back pain   . Dizziness     chronic, of ? etiology- with 1 ENT and 2 neuro work ups  . Iron deficiency     dx'd from bone marrow biopsy  . High blood pressure     Readings  . Hyperlipidemia     triglycerides; likely familial   Past Surgical History  Procedure Date  . Cholecystectomy 2006  . Breast biopsy 2005  . Tonsillectomy 1989  . Gyn surgery 2000    hysterectomy- total, for adhesions/ endometriosis  . Cardiac catheterization 2010    multivessel CAD, treated medically  . Bone marrow biopsy 08/2010    normal with low iron stores   History  Substance Use Topics  . Smoking status: Former Smoker    Quit date: 06/26/2009  . Smokeless tobacco: Never Used   Comment: Quit   . Alcohol Use: No   Family History  Problem Relation Age of Onset  . Diabetes Mother   . Hyperlipidemia Mother   .  Coronary artery disease Mother     hx of CABG in 50s  . Heart disease Mother     MI age 24, 4 vessel CABG  . Diabetes Father   . Hyperlipidemia Father   . Cancer Neg Hx   . Depression Neg Hx   . Anxiety disorder Neg Hx   . Heart disease Other     Grandparent  . Hypertension Other     Parent, grandparent, other family member  . Diabetes Other     Grandparent  . Kidney disease Other     Grandparent   Allergies  Allergen Reactions  . Ciprofloxacin   . Erythromycin   . Exenatide     Not effective   . Ivp Dye (Iodinated Diagnostic Agents)   . Sulfonamide Derivatives   . Sumatriptan    Current Outpatient Prescriptions on File Prior to Visit  Medication Sig Dispense Refill  . clomiPRAMINE (ANAFRANIL) 25 MG capsule Take 25 mg by mouth at bedtime.      . CRESTOR 40 MG tablet TAKE ONE TABLET BY MOUTH EVERY DAY  30 each  6  . escitalopram (LEXAPRO) 20 MG tablet Take 2 mg by mouth daily.      . insulin aspart (NOVOLOG) 100 UNIT/ML injection Inject 8 Units into the skin 3 (three) times daily before meals.      . insulin glargine  (LANTUS SOLOSTAR) 100 UNIT/ML injection Inject 30 Units into the skin every morning.       . metoprolol tartrate (LOPRESSOR) 25 MG tablet TAKE ONE TABLET BY MOUTH TWICE DAILY  180 tablet  3  . mirtazapine (REMERON) 45 MG tablet Take 1 tablet by mouth at bedtime.      Marland Kitchen NIASPAN 750 MG CR tablet TAKE TWO TABLETS BY MOUTH AT BEDTIME  60 each  11  . nitroGLYCERIN (NITROSTAT) 0.4 MG SL tablet Use as needed       . pregabalin (LYRICA) 75 MG capsule Take 75 mg by mouth 3 (three) times daily.      . promethazine (PHENERGAN) 25 MG suppository Place 1 suppository (25 mg total) rectally every 6 (six) hours as needed for nausea.  12 each  1   Current Facility-Administered Medications on File Prior to Visit  Medication Dose Route Frequency Provider Last Rate Last Dose  . DISCONTD: promethazine (PHENERGAN) injection 25 mg  25 mg Intravenous Once Judy Pimple, MD              Review of Systems Review of Systems  Constitutional: Negative for fever, appetite change, fatigue and unexpected weight change.  Eyes: Negative for pain and visual disturbance.  Respiratory: Negative for cough and shortness of breath.   Cardiovascular: Negative for cp or palpitations    Gastrointestinal: Negative for nausea, diarrhea and constipation.  Genitourinary: Negative for urgency and frequency.  Skin: Negative for pallor or rash   Neurological: Negative for weakness, light-headedness, numbness and headaches.  Hematological: Negative for adenopathy. Does not bruise/bleed easily.  Psychiatric/Behavioral: Negative for dysphoric mood. The patient is not nervous/anxious.         Objective:   Physical Exam  Constitutional: She is oriented to person, place, and time. She appears well-developed and well-nourished. No distress.       Fatigued and stressed but generally well appearing   HENT:  Head: Normocephalic and atraumatic.  Right Ear: External ear normal.  Left Ear: External ear normal.  Nose: Nose normal.    Mouth/Throat: Oropharynx is clear and moist.  Eyes:  Conjunctivae and EOM are normal. Pupils are equal, round, and reactive to light. Right eye exhibits no discharge. Left eye exhibits no discharge.  Neck: Normal range of motion. Neck supple. No JVD present. Carotid bruit is not present. No thyromegaly present.  Cardiovascular: Normal rate, regular rhythm, normal heart sounds and intact distal pulses.  Exam reveals no gallop.   No murmur heard. Pulmonary/Chest: Effort normal and breath sounds normal. No respiratory distress. She has no wheezes.  Abdominal: Soft. Bowel sounds are normal. She exhibits no distension and no mass. There is no tenderness.       No suprapubic tenderness    Musculoskeletal: Normal range of motion. She exhibits no edema and no tenderness.  Lymphadenopathy:    She has no cervical adenopathy.  Neurological: She is alert and oriented to person, place, and time. She has normal reflexes. She displays tremor. She displays no atrophy. No cranial nerve deficit or sensory deficit. She exhibits normal muscle tone. Coordination and gait normal.       No focal cerebellar signs Symmetric sens and strength Mild tremor -poss from anxiety  Skin: Skin is warm and dry. No rash noted. No erythema. No pallor.  Psychiatric: She has a normal mood and affect.          Assessment & Plan:

## 2011-12-15 NOTE — Assessment & Plan Note (Signed)
Pt is finishing keflex from uti in hospital No longer symptomatic  Could not give specimen today  Checking bmet for cr (which did rise in hosp - down to 1.28 at d/c)

## 2011-12-15 NOTE — Assessment & Plan Note (Signed)
I think this is worse after hospitalization due to the amt of fluids she received  No sob or other symptoms Adv drinking water/ avoiding sodium and update if not imp in 1 wk Also lab today

## 2011-12-15 NOTE — Assessment & Plan Note (Signed)
From migraine 50 mg phenergan today Is intermittent Also stress issues play role

## 2011-12-15 NOTE — Assessment & Plan Note (Signed)
S/p hosp with neurol features consistent with atypical migraine (but seizure and tia to be r/o) For neuro f/u [facility] on June 6 Trial of tramadol for pain  Phenergan 50 IM for nausea today  Fluids/ caff avoidance/ disc analgesic rebound Stressors disc  For f/u psychiatry also Update if not starting to improve in a week or if worsening

## 2011-12-15 NOTE — Assessment & Plan Note (Signed)
Disc her return to smoking  Disc in detail risks of smoking and possible outcomes including copd, vascular/ heart disease, cancer , respiratory and sinus infections  Pt voices understanding Pt not ready to quit yet due to severe stressors- but plans to in the future

## 2011-12-15 NOTE — Assessment & Plan Note (Addendum)
R upper body weakness and numbness- hosp at [facility] with neg CT MRI MRA brain - re assuring Reviewed hospital records and studies in detail with the pt  For EEG and neuro f/u  Sounds like most likely atypical/ complex migraine Overall much better now except for the headache  For neuro f/u Update if not starting to improve in a week or if worsening   >40 min spent with face to face with patient, >50% counseling and/or coordinating care

## 2011-12-15 NOTE — Patient Instructions (Addendum)
Keep drinking fluids and get rest  See the psychiatrist and also the neurologist as planned Stay away from caffeine  If your uti symptoms return- please let me know  Try the tramadol for headache- watch out for sedation - I sent to your pharmacy  Labs today  Phenergan shot today for nausea  Update if not starting to improve in a week or if worsening

## 2012-03-02 ENCOUNTER — Emergency Department: Payer: Self-pay | Admitting: Emergency Medicine

## 2012-03-02 ENCOUNTER — Telehealth: Payer: Self-pay | Admitting: Family Medicine

## 2012-03-02 NOTE — Telephone Encounter (Signed)
Agree with that recommendation - if she needed rabies prophylaxis would have to be thru ED any way

## 2012-03-02 NOTE — Telephone Encounter (Signed)
Caller: Shaniquia/Patient; PCP: Tower, Marne A.; CB#: [phone]; ; ; Call regarding Cat Bite; Occurred 03/01/12; states was going to adopt a kitten at [facility] when the cat bit her.  Report was filed with the shelter and animal control; the kitten was not up to date, as she was 7 weeks old.  Bite was a full thickness puncture, with a 1.5"-2" laceration to the side of the left thumb.  Afebrile.  Area is swollen and continues to bleed mildly on arising AM 03/02/12.  Per protocol, See Within 1 Hour/Emergent;  first available appointment per Epic is 03/02/12 1230, not within advised time frame.  Due to unvaccinated status of 11 week old kitten, advised ED; patient states she will go to [facility] ED.

## 2012-04-26 ENCOUNTER — Telehealth: Payer: Self-pay | Admitting: Family Medicine

## 2012-04-26 NOTE — Telephone Encounter (Signed)
Thanks for the update - she needs to be seen so if ER is her only opt I agree with that

## 2012-04-26 NOTE — Telephone Encounter (Signed)
04/26/2012:   Patient calling saying she is having intermittant shortness of breath episodes.  She has used Albuterol inhaler with no relief during attacks which  last for aproximately 15 minutes.   Patient currently  not short of breath  and denies chest pain  Patient states she has been having increasing fatigue  where she cannot go to mailbox without tiring.  Patient also has had nasal congestion wtih complaints that her cheeks below her eyes,  ears and teeth hurt.  Blood sugar was 160 this am which is normal for her as she takes her Lantis in the morning.   Patient states she had quit smoking but has started back up again which she knows is not helping.  Afebrile.  All emergent sxs ruled out per Diabetes: Respiratroy Problems with exception to 'Pain above or below eyes over sinuses (teeth/eyes may hurt)  Home Care Advice given.  RN advised patient to be evaluated in the next 4 hours and offered an appointment.   Patient concerned that she does not have money until Friday to pay for an appointment.  Spoke with Keri in office and RN was told that partial payment or billing could be arranged.  Patient informed of this but decided to have her daughter take her to the Emergency Department.

## 2012-04-27 ENCOUNTER — Emergency Department: Payer: Self-pay | Admitting: Emergency Medicine

## 2012-04-27 LAB — BASIC METABOLIC PANEL
BUN: 14 mg/dL (ref 7–18)
Chloride: 108 mmol/L — ABNORMAL HIGH (ref 98–107)
Creatinine: 1.12 mg/dL (ref 0.60–1.30)
Glucose: 170 mg/dL — ABNORMAL HIGH (ref 65–99)
Potassium: 4.4 mmol/L (ref 3.5–5.1)

## 2012-04-27 LAB — TROPONIN I: Troponin-I: <0.02 ng/mL

## 2012-04-27 LAB — URINALYSIS, COMPLETE
Bilirubin,UR: NEGATIVE
Blood: NEGATIVE
Glucose,UR: NEGATIVE mg/dL (ref 0–75)
Hyaline Cast: 9
Nitrite: NEGATIVE
Specific Gravity: 1.041 (ref 1.003–1.030)
Squamous Epithelial: 2

## 2012-04-27 LAB — CBC
HCT: 35.5 % (ref 35.0–47.0)
HGB: 12 g/dL (ref 12.0–16.0)
MCHC: 33.8 g/dL (ref 32.0–36.0)
MCV: 94 fL (ref 80–100)
Platelet: 418 10*3/uL (ref 150–440)
RDW: 13.6 % (ref 11.5–14.5)

## 2012-04-27 LAB — TSH: Thyroid Stimulating Horm: 1.73 u[IU]/mL

## 2012-05-12 ENCOUNTER — Encounter: Payer: Self-pay | Admitting: *Deleted

## 2012-05-12 ENCOUNTER — Encounter: Payer: Self-pay | Admitting: Family Medicine

## 2012-05-12 ENCOUNTER — Ambulatory Visit: Payer: Self-pay | Admitting: Family Medicine

## 2012-05-17 LAB — COMPREHENSIVE METABOLIC PANEL
Albumin: 3.6 g/dL (ref 3.4–5.0)
Alkaline Phosphatase: 104 U/L (ref 50–136)
BUN: 11 mg/dL (ref 7–18)
EGFR (African American): >60
Glucose: 238 mg/dL — ABNORMAL HIGH (ref 65–99)
SGOT(AST): 22 U/L (ref 15–37)
SGPT (ALT): 19 U/L (ref 12–78)
Total Protein: 8.4 g/dL — ABNORMAL HIGH (ref 6.4–8.2)

## 2012-05-17 LAB — CK TOTAL AND CKMB (NOT AT ARMC): CK-MB: <0.5 ng/mL — ABNORMAL LOW (ref 0.5–3.6)

## 2012-05-17 LAB — CBC
Platelet: 377 10*3/uL (ref 150–440)
RDW: 13 % (ref 11.5–14.5)

## 2012-05-18 ENCOUNTER — Inpatient Hospital Stay: Payer: Self-pay | Admitting: Internal Medicine

## 2012-05-20 LAB — WBC: WBC: 20.2 10*3/uL — ABNORMAL HIGH (ref 3.6–11.0)

## 2012-05-23 ENCOUNTER — Other Ambulatory Visit: Payer: Self-pay

## 2012-05-23 MED ORDER — ROSUVASTATIN CALCIUM 40 MG PO TABS: 40.0000 mg | ORAL_TABLET | Freq: Every day | ORAL | Status: AC

## 2012-05-23 NOTE — Telephone Encounter (Signed)
Refill sent for crestor 40 mg take one tablet daily at bedtime. 

## 2012-05-30 ENCOUNTER — Ambulatory Visit: Payer: Self-pay | Admitting: Internal Medicine

## 2012-05-31 ENCOUNTER — Ambulatory Visit: Payer: Managed Care, Other (non HMO) | Admitting: Family Medicine

## 2012-05-31 DIAGNOSIS — Z0289 Encounter for other administrative examinations: Secondary | ICD-10-CM

## 2012-06-10 ENCOUNTER — Other Ambulatory Visit: Payer: Self-pay

## 2012-06-10 ENCOUNTER — Ambulatory Visit: Payer: Managed Care, Other (non HMO) | Admitting: Cardiovascular Disease

## 2012-06-10 MED ORDER — METOPROLOL TARTRATE 25 MG PO TABS: 25.0000 mg | ORAL_TABLET | Freq: Two times a day (BID) | ORAL | Status: AC

## 2012-06-10 NOTE — Telephone Encounter (Signed)
Refill sent for metoprolol.  

## 2012-06-14 ENCOUNTER — Inpatient Hospital Stay: Payer: Self-pay | Admitting: Internal Medicine

## 2012-06-14 LAB — BASIC METABOLIC PANEL
BUN: 13 mg/dL (ref 7–18)
Chloride: 101 mmol/L (ref 98–107)
Co2: 24 mmol/L (ref 21–32)
Creatinine: 1.14 mg/dL (ref 0.60–1.30)
Sodium: 134 mmol/L — ABNORMAL LOW (ref 136–145)

## 2012-06-14 LAB — TROPONIN I: Troponin-I: <0.02 ng/mL

## 2012-06-14 LAB — CBC WITH DIFFERENTIAL/PLATELET
Basophil #: 0.1 10*3/uL (ref 0.0–0.1)
Eosinophil #: 0.1 10*3/uL (ref 0.0–0.7)
Lymphocyte %: 15.7 %
Monocyte #: 1.4 x10 3/mm — ABNORMAL HIGH (ref 0.2–0.9)
Monocyte %: 5.8 %
Platelet: 343 10*3/uL (ref 150–440)
RDW: 13.9 % (ref 11.5–14.5)
WBC: 23.7 10*3/uL — ABNORMAL HIGH (ref 3.6–11.0)

## 2012-06-14 LAB — PRO B NATRIURETIC PEPTIDE: B-Type Natriuretic Peptide: 92 pg/mL (ref 0–125)

## 2012-06-15 LAB — BASIC METABOLIC PANEL
Anion Gap: 8 (ref 7–16)
BUN: 19 mg/dL — ABNORMAL HIGH (ref 7–18)
Co2: 26 mmol/L (ref 21–32)
Creatinine: 1.2 mg/dL (ref 0.60–1.30)
EGFR (African American): >60
EGFR (Non-African Amer.): 54 — ABNORMAL LOW
Glucose: 298 mg/dL — ABNORMAL HIGH (ref 65–99)

## 2012-06-15 LAB — CBC WITH DIFFERENTIAL/PLATELET
Eosinophil #: 0 10*3/uL (ref 0.0–0.7)
MCH: 31.5 pg (ref 26.0–34.0)
MCHC: 33.5 g/dL (ref 32.0–36.0)
Monocyte #: 0.7 x10 3/mm (ref 0.2–0.9)
Neutrophil %: 89.2 %
Platelet: 361 10*3/uL (ref 150–440)
RBC: 3.86 10*6/uL (ref 3.80–5.20)

## 2012-06-16 LAB — PROTIME-INR: Prothrombin Time: 12.1 secs (ref 11.5–14.7)

## 2012-06-16 LAB — LIPASE, BLOOD: Lipase: 111 U/L (ref 73–393)

## 2012-06-16 LAB — VANCOMYCIN, TROUGH: Vancomycin, Trough: 9 ug/mL — ABNORMAL LOW (ref 10–20)

## 2012-06-16 LAB — APTT: Activated PTT: 23.4 secs — ABNORMAL LOW (ref 23.6–35.9)

## 2012-06-18 LAB — CBC WITH DIFFERENTIAL/PLATELET
Eosinophil %: 0 %
Lymphocyte #: 2.3 10*3/uL (ref 1.0–3.6)
Monocyte %: 7.8 %
Neutrophil %: 80.7 %
Platelet: 393 10*3/uL (ref 150–440)
RBC: 3.92 10*6/uL (ref 3.80–5.20)
RDW: 13.6 % (ref 11.5–14.5)
WBC: 19.8 10*3/uL — ABNORMAL HIGH (ref 3.6–11.0)

## 2012-06-19 LAB — CREATININE, SERUM: EGFR (African American): >60

## 2012-06-20 LAB — BASIC METABOLIC PANEL
Anion Gap: 6 — ABNORMAL LOW (ref 7–16)
BUN: 20 mg/dL — ABNORMAL HIGH (ref 7–18)
Calcium, Total: 8.7 mg/dL (ref 8.5–10.1)
Chloride: 101 mmol/L (ref 98–107)
Co2: 27 mmol/L (ref 21–32)
Glucose: 324 mg/dL — ABNORMAL HIGH (ref 65–99)
Osmolality: 283 (ref 275–301)
Sodium: 134 mmol/L — ABNORMAL LOW (ref 136–145)

## 2012-06-20 LAB — CBC WITH DIFFERENTIAL/PLATELET
Basophil %: 0.1 %
Eosinophil #: 0.2 10*3/uL (ref 0.0–0.7)
HGB: 12.8 g/dL (ref 12.0–16.0)
MCH: 30.6 pg (ref 26.0–34.0)
MCHC: 32.3 g/dL (ref 32.0–36.0)
Neutrophil %: 88 %
RDW: 13.3 % (ref 11.5–14.5)

## 2012-06-29 ENCOUNTER — Ambulatory Visit: Payer: Self-pay | Admitting: Cardiothoracic Surgery

## 2012-07-01 ENCOUNTER — Ambulatory Visit: Payer: Managed Care, Other (non HMO) | Admitting: Cardiovascular Disease

## 2012-07-09 ENCOUNTER — Inpatient Hospital Stay: Payer: Self-pay | Admitting: Student

## 2012-07-09 LAB — COMPREHENSIVE METABOLIC PANEL
Alkaline Phosphatase: 140 U/L — ABNORMAL HIGH (ref 50–136)
BUN: 14 mg/dL (ref 7–18)
Bilirubin,Total: 0.2 mg/dL (ref 0.2–1.0)
Calcium, Total: 9.2 mg/dL (ref 8.5–10.1)
Chloride: 101 mmol/L (ref 98–107)
Co2: 20 mmol/L — ABNORMAL LOW (ref 21–32)
Creatinine: 1.41 mg/dL — ABNORMAL HIGH (ref 0.60–1.30)
Potassium: 4 mmol/L (ref 3.5–5.1)
SGPT (ALT): 27 U/L (ref 12–78)
Total Protein: 7.5 g/dL (ref 6.4–8.2)

## 2012-07-09 LAB — CBC WITH DIFFERENTIAL/PLATELET
Basophil %: 0.2 %
Eosinophil #: 0 10*3/uL (ref 0.0–0.7)
Eosinophil %: 0 %
HGB: 11.4 g/dL — ABNORMAL LOW (ref 12.0–16.0)
Lymphocyte #: 1.1 10*3/uL (ref 1.0–3.6)
Lymphocyte %: 5.2 %
MCH: 31.3 pg (ref 26.0–34.0)
MCV: 96 fL (ref 80–100)
Monocyte #: 0.7 x10 3/mm (ref 0.2–0.9)
Monocyte %: 3.3 %
Neutrophil %: 91.3 %
RBC: 3.64 10*6/uL — ABNORMAL LOW (ref 3.80–5.20)
RDW: 14.7 % — ABNORMAL HIGH (ref 11.5–14.5)
WBC: 20.4 10*3/uL — ABNORMAL HIGH (ref 3.6–11.0)

## 2012-07-09 LAB — URINALYSIS, COMPLETE
Ph: 6 (ref 4.5–8.0)
Protein: NEGATIVE
Specific Gravity: 1.03 (ref 1.003–1.030)
WBC UR: 1 /HPF (ref 0–5)

## 2012-07-11 LAB — CBC WITH DIFFERENTIAL/PLATELET
Basophil #: 0.1 10*3/uL (ref 0.0–0.1)
Basophil %: 0.6 %
Eosinophil #: 0.1 10*3/uL (ref 0.0–0.7)
HCT: 32.7 % — ABNORMAL LOW (ref 35.0–47.0)
HGB: 10.1 g/dL — ABNORMAL LOW (ref 12.0–16.0)
Lymphocyte #: 4.3 10*3/uL — ABNORMAL HIGH (ref 1.0–3.6)
MCHC: 30.8 g/dL — ABNORMAL LOW (ref 32.0–36.0)
MCV: 94 fL (ref 80–100)
Monocyte %: 5.6 %
Neutrophil #: 10.4 10*3/uL — ABNORMAL HIGH (ref 1.4–6.5)
Platelet: 379 10*3/uL (ref 150–440)
RDW: 14.1 % (ref 11.5–14.5)
WBC: 15.8 10*3/uL — ABNORMAL HIGH (ref 3.6–11.0)

## 2012-07-11 LAB — BASIC METABOLIC PANEL
Anion Gap: 8 (ref 7–16)
BUN: 12 mg/dL (ref 7–18)
Creatinine: 0.93 mg/dL (ref 0.60–1.30)
EGFR (African American): >60
Sodium: 140 mmol/L (ref 136–145)

## 2012-07-12 LAB — CBC WITH DIFFERENTIAL/PLATELET
Basophil #: 0.1 10*3/uL (ref 0.0–0.1)
Basophil %: 0.6 %
Eosinophil %: 0.7 %
Lymphocyte %: 32.9 %
MCH: 30.3 pg (ref 26.0–34.0)
MCV: 93 fL (ref 80–100)
Monocyte #: 1 x10 3/mm — ABNORMAL HIGH (ref 0.2–0.9)
Monocyte %: 5.8 %
Platelet: 447 10*3/uL — ABNORMAL HIGH (ref 150–440)
RBC: 3.66 10*6/uL — ABNORMAL LOW (ref 3.80–5.20)
RDW: 14.2 % (ref 11.5–14.5)

## 2012-07-12 LAB — BASIC METABOLIC PANEL
BUN: 13 mg/dL (ref 7–18)
Calcium, Total: 9.3 mg/dL (ref 8.5–10.1)
Chloride: 107 mmol/L (ref 98–107)
Creatinine: 1.31 mg/dL — ABNORMAL HIGH (ref 0.60–1.30)
EGFR (Non-African Amer.): 48 — ABNORMAL LOW
Glucose: 130 mg/dL — ABNORMAL HIGH (ref 65–99)
Potassium: 4.2 mmol/L (ref 3.5–5.1)
Sodium: 141 mmol/L (ref 136–145)

## 2012-07-12 LAB — VANCOMYCIN, TROUGH: Vancomycin, Trough: 8 ug/mL — ABNORMAL LOW (ref 10–20)

## 2012-07-14 LAB — CBC WITH DIFFERENTIAL/PLATELET
Basophil %: 0.2 %
Eosinophil #: 0 10*3/uL (ref 0.0–0.7)
Eosinophil %: 0.1 %
HCT: 33.2 % — ABNORMAL LOW (ref 35.0–47.0)
HGB: 10.7 g/dL — ABNORMAL LOW (ref 12.0–16.0)
Lymphocyte %: 12.1 %
MCH: 30.4 pg (ref 26.0–34.0)
MCHC: 32.3 g/dL (ref 32.0–36.0)
Monocyte #: 0.6 x10 3/mm (ref 0.2–0.9)
Neutrophil #: 14.7 10*3/uL — ABNORMAL HIGH (ref 1.4–6.5)
Platelet: 423 10*3/uL (ref 150–440)

## 2012-07-14 LAB — BASIC METABOLIC PANEL
Calcium, Total: 8.7 mg/dL (ref 8.5–10.1)
Chloride: 107 mmol/L (ref 98–107)
Co2: 27 mmol/L (ref 21–32)
EGFR (Non-African Amer.): 58 — ABNORMAL LOW
Potassium: 4 mmol/L (ref 3.5–5.1)
Sodium: 140 mmol/L (ref 136–145)

## 2012-07-15 LAB — CBC WITH DIFFERENTIAL/PLATELET
Basophil #: 0.1 10*3/uL (ref 0.0–0.1)
Eosinophil #: 0.1 10*3/uL (ref 0.0–0.7)
HCT: 33.4 % — ABNORMAL LOW (ref 35.0–47.0)
HGB: 10.9 g/dL — ABNORMAL LOW (ref 12.0–16.0)
Lymphocyte %: 14.4 %
MCV: 93 fL (ref 80–100)
Monocyte %: 4.2 %
Neutrophil #: 16.3 10*3/uL — ABNORMAL HIGH (ref 1.4–6.5)
Neutrophil %: 80.5 %
Platelet: 430 10*3/uL (ref 150–440)
RDW: 14.3 % (ref 11.5–14.5)

## 2012-07-15 LAB — CULTURE, BLOOD (SINGLE)

## 2012-07-16 LAB — CBC WITH DIFFERENTIAL/PLATELET
Bands: 4 %
Comment - H1-Com1: NORMAL
HCT: 31.9 % — ABNORMAL LOW (ref 35.0–47.0)
HGB: 10.2 g/dL — ABNORMAL LOW (ref 12.0–16.0)
Lymphocytes: 32 %
MCV: 94 fL (ref 80–100)
Myelocyte: 3 %
RDW: 14.4 % (ref 11.5–14.5)
WBC: 21.3 10*3/uL — ABNORMAL HIGH (ref 3.6–11.0)

## 2012-07-27 ENCOUNTER — Emergency Department: Payer: Self-pay | Admitting: Emergency Medicine

## 2012-07-27 ENCOUNTER — Ambulatory Visit: Payer: Self-pay | Admitting: Cardiothoracic Surgery

## 2012-07-27 LAB — TROPONIN I: Troponin-I: <0.02 ng/mL

## 2012-07-27 LAB — URINALYSIS, COMPLETE
Bacteria: NONE SEEN
Bilirubin,UR: NEGATIVE
Nitrite: NEGATIVE
Protein: NEGATIVE
Specific Gravity: 1.026 (ref 1.003–1.030)
WBC UR: 1 /HPF (ref 0–5)

## 2012-07-27 LAB — COMPREHENSIVE METABOLIC PANEL
Anion Gap: 14 (ref 7–16)
Calcium, Total: 9.2 mg/dL (ref 8.5–10.1)
Co2: 22 mmol/L (ref 21–32)
EGFR (African American): 50 — ABNORMAL LOW
EGFR (Non-African Amer.): 43 — ABNORMAL LOW
Osmolality: 292 (ref 275–301)
SGOT(AST): 14 U/L — ABNORMAL LOW (ref 15–37)
SGPT (ALT): 42 U/L (ref 12–78)
Total Protein: 7.7 g/dL (ref 6.4–8.2)

## 2012-07-27 LAB — CBC
HCT: 39.6 % (ref 35.0–47.0)
HGB: 12.5 g/dL (ref 12.0–16.0)
MCH: 30.1 pg (ref 26.0–34.0)
MCHC: 31.7 g/dL — ABNORMAL LOW (ref 32.0–36.0)
Platelet: 396 10*3/uL (ref 150–440)
RDW: 15.7 % — ABNORMAL HIGH (ref 11.5–14.5)
WBC: 17.9 10*3/uL — ABNORMAL HIGH (ref 3.6–11.0)

## 2012-07-27 LAB — PRO B NATRIURETIC PEPTIDE: B-Type Natriuretic Peptide: 125 pg/mL (ref 0–125)

## 2012-07-27 LAB — CK TOTAL AND CKMB (NOT AT ARMC): CK, Total: 31 U/L (ref 21–215)

## 2012-08-04 ENCOUNTER — Emergency Department: Payer: Self-pay | Admitting: Emergency Medicine

## 2012-08-04 LAB — BASIC METABOLIC PANEL
Anion Gap: 9 (ref 7–16)
BUN: 10 mg/dL (ref 7–18)
Chloride: 104 mmol/L (ref 98–107)
Co2: 27 mmol/L (ref 21–32)
Creatinine: 1.25 mg/dL (ref 0.60–1.30)
EGFR (African American): 59 — ABNORMAL LOW
EGFR (Non-African Amer.): 51 — ABNORMAL LOW
Sodium: 140 mmol/L (ref 136–145)

## 2012-08-04 LAB — CBC
HGB: 12.1 g/dL (ref 12.0–16.0)
MCHC: 31.6 g/dL — ABNORMAL LOW (ref 32.0–36.0)
MCV: 94 fL (ref 80–100)
RBC: 4.09 10*6/uL (ref 3.80–5.20)
RDW: 16.3 % — ABNORMAL HIGH (ref 11.5–14.5)

## 2012-08-05 ENCOUNTER — Ambulatory Visit: Payer: Managed Care, Other (non HMO) | Admitting: Cardiovascular Disease

## 2012-08-05 ENCOUNTER — Encounter: Payer: Self-pay | Admitting: *Deleted

## 2012-08-12 ENCOUNTER — Ambulatory Visit: Payer: Self-pay | Admitting: Physician Assistant

## 2012-08-19 ENCOUNTER — Emergency Department: Payer: Self-pay | Admitting: Emergency Medicine

## 2012-08-19 LAB — CBC
MCHC: 32.8 g/dL (ref 32.0–36.0)
Platelet: 386 10*3/uL (ref 150–440)
RBC: 4.05 10*6/uL (ref 3.80–5.20)
RDW: 16.6 % — ABNORMAL HIGH (ref 11.5–14.5)
WBC: 20.1 10*3/uL — ABNORMAL HIGH (ref 3.6–11.0)

## 2012-08-19 LAB — COMPREHENSIVE METABOLIC PANEL
Albumin: 4.1 g/dL (ref 3.4–5.0)
BUN: 23 mg/dL — ABNORMAL HIGH (ref 7–18)
Bilirubin,Total: 0.3 mg/dL (ref 0.2–1.0)
Calcium, Total: 9.4 mg/dL (ref 8.5–10.1)
Co2: 23 mmol/L (ref 21–32)
Creatinine: 1.37 mg/dL — ABNORMAL HIGH (ref 0.60–1.30)
Glucose: 231 mg/dL — ABNORMAL HIGH (ref 65–99)
Potassium: 4.4 mmol/L (ref 3.5–5.1)
SGPT (ALT): 36 U/L (ref 12–78)
Sodium: 135 mmol/L — ABNORMAL LOW (ref 136–145)
Total Protein: 7.5 g/dL (ref 6.4–8.2)

## 2012-08-19 LAB — URINALYSIS, COMPLETE
Bilirubin,UR: NEGATIVE
Glucose,UR: >=500 mg/dL (ref 0–75)
Ph: 6 (ref 4.5–8.0)
RBC,UR: 3 /HPF (ref 0–5)
Squamous Epithelial: 1
WBC UR: 4 /HPF (ref 0–5)

## 2012-08-19 LAB — CK TOTAL AND CKMB (NOT AT ARMC): CK, Total: 64 U/L (ref 21–215)

## 2012-08-19 LAB — TROPONIN I: Troponin-I: <0.02 ng/mL

## 2012-09-01 ENCOUNTER — Inpatient Hospital Stay: Payer: Self-pay | Admitting: Internal Medicine

## 2012-09-01 LAB — URINALYSIS, COMPLETE
Bilirubin,UR: NEGATIVE
Blood: NEGATIVE
Glucose,UR: >=500 mg/dL (ref 0–75)
Leukocyte Esterase: NEGATIVE
Protein: NEGATIVE
RBC,UR: 2 /HPF (ref 0–5)
WBC UR: <1 /HPF (ref 0–5)

## 2012-09-01 LAB — CBC
HCT: 39.3 % (ref 35.0–47.0)
MCH: 31.1 pg (ref 26.0–34.0)
MCHC: 32.2 g/dL (ref 32.0–36.0)
MCV: 97 fL (ref 80–100)
RBC: 4.07 10*6/uL (ref 3.80–5.20)

## 2012-09-01 LAB — COMPREHENSIVE METABOLIC PANEL
Alkaline Phosphatase: 95 U/L (ref 50–136)
BUN: 20 mg/dL — ABNORMAL HIGH (ref 7–18)
Bilirubin,Total: 0.2 mg/dL (ref 0.2–1.0)
Chloride: 105 mmol/L (ref 98–107)
Creatinine: 1.33 mg/dL — ABNORMAL HIGH (ref 0.60–1.30)
Glucose: 214 mg/dL — ABNORMAL HIGH (ref 65–99)
Potassium: 4.8 mmol/L (ref 3.5–5.1)
SGOT(AST): 21 U/L (ref 15–37)
SGPT (ALT): 37 U/L (ref 12–78)
Sodium: 135 mmol/L — ABNORMAL LOW (ref 136–145)
Total Protein: 7.9 g/dL (ref 6.4–8.2)

## 2012-09-02 LAB — CBC WITH DIFFERENTIAL/PLATELET
Eosinophil #: 0.2 10*3/uL (ref 0.0–0.7)
HCT: 37.3 % (ref 35.0–47.0)
HGB: 12.2 g/dL (ref 12.0–16.0)
MCV: 96 fL (ref 80–100)
Monocyte #: 0.8 x10 3/mm (ref 0.2–0.9)
Monocyte %: 5.7 %
Neutrophil #: 7.8 10*3/uL — ABNORMAL HIGH (ref 1.4–6.5)
Neutrophil %: 52.9 %
Platelet: 349 10*3/uL (ref 150–440)
RBC: 3.89 10*6/uL (ref 3.80–5.20)
RDW: 16.2 % — ABNORMAL HIGH (ref 11.5–14.5)

## 2012-09-02 LAB — BASIC METABOLIC PANEL
Anion Gap: 8 (ref 7–16)
BUN: 14 mg/dL (ref 7–18)
Calcium, Total: 8.4 mg/dL — ABNORMAL LOW (ref 8.5–10.1)
Co2: 24 mmol/L (ref 21–32)
Creatinine: 1.13 mg/dL (ref 0.60–1.30)
Glucose: 89 mg/dL (ref 65–99)
Osmolality: 283 (ref 275–301)
Potassium: 3.9 mmol/L (ref 3.5–5.1)
Sodium: 142 mmol/L (ref 136–145)

## 2012-09-05 ENCOUNTER — Ambulatory Visit (INDEPENDENT_AMBULATORY_CARE_PROVIDER_SITE_OTHER): Payer: Managed Care, Other (non HMO) | Admitting: Cardiovascular Disease

## 2012-09-05 ENCOUNTER — Encounter: Payer: Self-pay | Admitting: Cardiovascular Disease

## 2012-09-05 VITALS — BP 135/84 | HR 92 | Ht 60.5 in | Wt 214.8 lb

## 2012-09-05 DIAGNOSIS — E785 Hyperlipidemia, unspecified: Secondary | ICD-10-CM

## 2012-09-05 DIAGNOSIS — I10 Essential (primary) hypertension: Secondary | ICD-10-CM

## 2012-09-05 DIAGNOSIS — R0789 Other chest pain: Secondary | ICD-10-CM

## 2012-09-05 DIAGNOSIS — E119 Type 2 diabetes mellitus without complications: Secondary | ICD-10-CM

## 2012-09-05 DIAGNOSIS — R0602 Shortness of breath: Secondary | ICD-10-CM

## 2012-09-05 DIAGNOSIS — I2510 Atherosclerotic heart disease of native coronary artery without angina pectoris: Secondary | ICD-10-CM

## 2012-09-05 DIAGNOSIS — F17200 Nicotine dependence, unspecified, uncomplicated: Secondary | ICD-10-CM

## 2012-09-05 NOTE — Progress Notes (Signed)
Patient ID: Anna Pena, female    DOB: 1966/03/06, 47 y.o.   MRN: [id number]  HPI Comments: 47 year-old woman with cardiac cath May 2012 showing 50% LAD disease, 70% left circumflex disease, severe distal RCA disease,  long history of smoking who continues to smoke,  Obesity,  anxiety/depression who presents for routine followup. prior pneumonia. She reports recent long course of steroids for interstitial lung disease, now followed at [facility].    She reports a very occult several months with initial valuation by the hospital and lung biopsy suggesting pulmonary fibrosis. This diagnosis was considered less likely after further consultation at [facility]. She has had a long course of prednisone now only starting a slow taper after 4 months. From the prednisone, she has had complications including significant weight gain.   She reports having tightness in her chest, Frequent heaviness, also heartburn. Symptoms sometimes with exertion. She is concerned about underlying coronary artery disease. She was told by pulmonary at [facility] that she would likely need a right heart catheterization to exclude pulmonary hypertension. By her report, CT scan has showed improved lung function with steroids, no significant improvement in her chest pain.   She also reports having episodes of spinning like she is going to pass out. People have noticed her shaking of her arm and leg like she is having a seizure. This has been happening on a frequent basis.    She does have a reaction to contrast.   EKG shows sinus Rhythm with rate 92 beats per minute with no significant ST or T wave changes, no significant ST or T wave changes      Outpatient Encounter Prescriptions as of 09/05/2012  Medication Sig Dispense Refill  . albuterol (VENTOLIN HFA) 108 (90 BASE) MCG/ACT inhaler Inhale 2 puffs into the lungs as needed for wheezing.      Marland Kitchen aspirin 81 MG tablet Take 81 mg by mouth daily.      . Calcium Carbonate-Vitamin  D (OSCAL 500/200 D-3 PO) Take by mouth daily.      . clomiPRAMINE (ANAFRANIL) 50 MG capsule Takes 3 tablets at night.      Marland Kitchen DOXYCYCLINE, ROSACEA, PO Take by mouth. Takes 2 tablets daily 10 x days.      . ergocalciferol (VITAMIN D2) 50000 UNITS capsule Take 50,000 Units by mouth once a week.      . escitalopram (LEXAPRO) 20 MG tablet Takes 2 1/2 tablet daily.      . Fluticasone-Salmeterol (ADVAIR DISKUS) 250-50 MCG/DOSE AEPB Inhale 2 puffs into the lungs every 12 (twelve) hours.      Marland Kitchen glimepiride (AMARYL) 1 MG tablet Take 2 mg by mouth daily before breakfast.      . HYDROmorphone (DILAUDID) 2 MG tablet Take 2 mg by mouth 4 (four) times daily.      . insulin aspart (NOVOLOG) 100 UNIT/ML injection Inject 12 Units into the skin 3 (three) times daily before meals.       . insulin glargine (LANTUS SOLOSTAR) 100 UNIT/ML injection Inject 45 Units into the skin every morning.       Marland Kitchen LORazepam (ATIVAN) 2 MG tablet Takes 3 tablets daily.      . metoprolol tartrate (LOPRESSOR) 25 MG tablet Take 1 tablet (25 mg total) by mouth 2 (two) times daily.  180 tablet  3  . mirtazapine (REMERON) 45 MG tablet Take 1 tablet by mouth at bedtime.      . Multiple Vitamin (MULTIVITAMIN) tablet Take 1 tablet by  mouth daily.      . mycophenolate (CELLCEPT) 500 MG tablet Take 1,000 mg by mouth daily.      Marland Kitchen NIASPAN 750 MG CR tablet TAKE TWO TABLETS BY MOUTH AT BEDTIME  60 each  11  . nitroGLYCERIN (NITROSTAT) 0.4 MG SL tablet Use as needed       . predniSONE (DELTASONE) 10 MG tablet Take 20 mg by mouth daily.      . pregabalin (LYRICA) 75 MG capsule Take 75 mg by mouth 3 (three) times daily.      . rosuvastatin (CRESTOR) 40 MG tablet Take 1 tablet (40 mg total) by mouth daily.  30 tablet  6  . spironolactone (ALDACTONE) 50 MG tablet Take 50 mg by mouth daily.      . temazepam (RESTORIL) 15 MG capsule 30 mg daily.      Marland Kitchen tiotropium (SPIRIVA) 18 MCG inhalation capsule Place 18 mcg into inhaler and inhale daily.      .  [DISCONTINUED] clomiPRAMINE (ANAFRANIL) 25 MG capsule Take 25 mg by mouth at bedtime.      . [DISCONTINUED] promethazine (PHENERGAN) 25 MG suppository Place 1 suppository (25 mg total) rectally every 6 (six) hours as needed for nausea.  12 each  1   Review of Systems  Constitutional: Negative.   HENT: Negative.   Eyes: Negative.   Respiratory: Negative.   Cardiovascular: Negative.   Gastrointestinal: Negative.   Musculoskeletal: Negative.   Skin: Negative.   Neurological: Positive for dizziness and seizures.  Psychiatric/Behavioral: Positive for sleep disturbance. The patient is nervous/anxious.   All other systems reviewed and are negative.    Ht 5' 0.5" (1.537 m)  Wt 214 lb 12 oz (97.41 kg)  BMI 41.23 kg/m2  Physical Exam  Nursing note and vitals reviewed. Constitutional: She is oriented to person, place, and time. She appears well-developed and well-nourished.  obese  HENT:  Head: Normocephalic.  Nose: Nose normal.  Mouth/Throat: Oropharynx is clear and moist.  Eyes: Conjunctivae are normal. Pupils are equal, round, and reactive to light.  Neck: Normal range of motion. Neck supple. No JVD present.  Cardiovascular: Normal rate, regular rhythm, S1 normal, S2 normal, normal heart sounds and intact distal pulses.  Exam reveals no gallop and no friction rub.   No murmur heard. Pulmonary/Chest: Effort normal and breath sounds normal. No respiratory distress. She has no wheezes. She has no rales. She exhibits no tenderness.  Abdominal: Soft. Bowel sounds are normal. She exhibits no distension. There is no tenderness.  Musculoskeletal: Normal range of motion. She exhibits no edema and no tenderness.  Lymphadenopathy:    She has no cervical adenopathy.  Neurological: She is alert and oriented to person, place, and time. Coordination normal.  Skin: Skin is warm and dry. No rash noted. No erythema.  Psychiatric: She has a normal mood and affect. Her behavior is normal. Judgment and  thought content normal.    Assessment and Plan

## 2012-09-05 NOTE — Assessment & Plan Note (Signed)
History of significant CAD in May 2012. Now with burning in her chest, chest pain episodes, worsening shortness of breath. Given her persistent and worsening symptoms and prior history of CAD, cardiac catheterization will be scheduled for later this week. We'll do a right heart catheterization to evaluate her right heart pressures given her recent lung problems.

## 2012-09-05 NOTE — Patient Instructions (Addendum)
No medication changes were made.  We will schedule you for a cardiac cath this Friday  Please call us if you have new issues that need to be addressed before your next appt.  Your physician wants you to follow-up in: 1 month  [facility] Cardiac Cath Instructions   You are scheduled for a Cardiac Cath on:______Friday 2/14/14___________________  Please arrive at ____7:45 am___am on the day of your procedure  You will need to pre-register prior to the day of your procedure.  Enter through the [employer] at [facility].  Registration is the first desk on your right.  Please take the procedure order we have given you in order to be registered appropriately  Do not eat/drink anything after midnight  Someone will need to drive you home  It is recommended someone be with you for the first 24 hours after your procedure  Wear clothes that are easy to get on/off and wear slip on shoes if possible   Medications bring a current list of all medications with you  _x__ Do not take these medications before your procedure:_hold amaryl and insulin morning of procedure _______________________________________________________________________________________________________________________x_Take 60 bmg prednisone night before procedure. Take usual dose of prednisone morning of procedure.  _x___Take remainder of medications with a sip of water morning of procedure. ______________________________________________________________________________________   Day of your procedure: Arrive at the [facility] entrance.  Free valet service is available.  After entering the [facility] you will pass on the right admitting/registration desk, proceed pass the Cardiopulmonary desk to the first open hallway on your right, overhead you will see a sign reading Cardiac Cath./Special Procedures Waiting Room.  After taking the hallway, the waiting room is on the left, once you enter the waiting room, there is a wall phone on the left,  just inside the door.  Dial [phone] this will connect you to the Special Recovery Nurse's Station; let them know you have arrived.  The usual length of stay after your procedure is about 2 to 3 hours.  This can vary.  If you have any questions, please call our office at [phone], or you may call the cardiac cath lab at [facility] directly at [phone]

## 2012-09-05 NOTE — Assessment & Plan Note (Signed)
Blood pressure is well controlled on today's visit. No changes made to the medications. 

## 2012-09-05 NOTE — Assessment & Plan Note (Signed)
We have encouraged her to continue to work on weaning her cigarettes and smoking cessation. She will continue to work on this and does not want any assistance with chantix.  

## 2012-09-05 NOTE — Assessment & Plan Note (Signed)
She reports sugars have not been that well controlled. This plus her smoking puts her at high risk of worsening coronary artery disease.

## 2012-09-05 NOTE — Assessment & Plan Note (Signed)
We will need new lipid panel given her recent weight gain. Last lipid panel was 1 year ago

## 2012-09-06 LAB — CBC WITH DIFFERENTIAL
Basophils Absolute: 0.1 10*3/uL (ref 0.0–0.2)
Eosinophils Absolute: 0.2 10*3/uL (ref 0.0–0.4)
Immature Grans (Abs): 0.1 10*3/uL (ref 0.0–0.1)
Immature Granulocytes: 0 % (ref 0–2)
Lymphs: 16 % (ref 14–46)
MCHC: 32.8 g/dL (ref 31.5–35.7)
Monocytes: 5 % (ref 4–12)
Neutrophils Relative %: 78 % — ABNORMAL HIGH (ref 40–74)
Platelets: 344 10*3/uL (ref 155–379)
RDW: 16.4 % — ABNORMAL HIGH (ref 12.3–15.4)
WBC: 19.2 10*3/uL — ABNORMAL HIGH (ref 3.4–10.8)

## 2012-09-06 LAB — BASIC METABOLIC PANEL
CO2: 21 mmol/L (ref 19–28)
Calcium: 10.2 mg/dL (ref 8.7–10.2)
Creatinine, Ser: 0.95 mg/dL (ref 0.57–1.00)
GFR calc non Af Amer: 71 mL/min/{1.73_m2} (ref >59)
Glucose: 217 mg/dL — ABNORMAL HIGH (ref 65–99)
Potassium: 4.7 mmol/L (ref 3.5–5.2)
Sodium: 141 mmol/L (ref 134–144)

## 2012-09-06 LAB — PROTIME-INR
INR: 1 (ref 0.8–1.2)
Prothrombin Time: 10.4 s (ref 9.1–12.0)

## 2012-09-07 LAB — CULTURE, BLOOD (SINGLE)

## 2012-09-09 ENCOUNTER — Ambulatory Visit: Payer: Self-pay | Admitting: Cardiovascular Disease

## 2012-09-09 DIAGNOSIS — R079 Chest pain, unspecified: Secondary | ICD-10-CM

## 2012-09-12 ENCOUNTER — Telehealth: Payer: Self-pay | Admitting: *Deleted

## 2012-09-12 ENCOUNTER — Other Ambulatory Visit: Payer: Self-pay

## 2012-09-12 MED ORDER — SPIRONOLACTONE 50 MG PO TABS: 50.0000 mg | ORAL_TABLET | Freq: Every day | ORAL | Status: DC

## 2012-09-12 MED ORDER — FUROSEMIDE 20 MG PO TABS: ORAL_TABLET | ORAL | Status: DC

## 2012-09-12 NOTE — Telephone Encounter (Signed)
Pt informed Understanding verb RX sent to pharm 

## 2012-09-12 NOTE — Telephone Encounter (Signed)
Pt had a cath on Friday and was told Mariah Milling would call in a script for lasix. No script has been sent in. Pt also has question concerning another med she has.

## 2012-09-12 NOTE — Telephone Encounter (Signed)
"  Have pt start lasix 20 mg qd PRN sob. If she is having to take this daily, she should call us to let us know and we would need to check BMP in 2-3 weeks. Have pt eat a banana on days she takes lasix." VO Dr. Alvis Lemmings, RN

## 2012-09-12 NOTE — Telephone Encounter (Signed)
Will discuss with Dr. Mariah Milling and call pt with response

## 2012-09-13 ENCOUNTER — Other Ambulatory Visit: Payer: Self-pay | Admitting: *Deleted

## 2012-09-14 MED ORDER — SPIRONOLACTONE 50 MG PO TABS: 50.0000 mg | ORAL_TABLET | Freq: Every day | ORAL | Status: AC

## 2012-09-20 ENCOUNTER — Encounter: Payer: Self-pay | Admitting: Cardiovascular Disease

## 2012-10-11 ENCOUNTER — Emergency Department: Payer: Self-pay | Admitting: Emergency Medicine

## 2012-10-11 LAB — COMPREHENSIVE METABOLIC PANEL
Bilirubin,Total: 0.2 mg/dL (ref 0.2–1.0)
Calcium, Total: 9.6 mg/dL (ref 8.5–10.1)
Chloride: 98 mmol/L (ref 98–107)
Co2: 27 mmol/L (ref 21–32)
EGFR (Non-African Amer.): 58 — ABNORMAL LOW
Glucose: 239 mg/dL — ABNORMAL HIGH (ref 65–99)
Osmolality: 281 (ref 275–301)
Sodium: 136 mmol/L (ref 136–145)
Total Protein: 7.7 g/dL (ref 6.4–8.2)

## 2012-10-11 LAB — CBC
HCT: 40.5 % (ref 35.0–47.0)
HGB: 13.3 g/dL (ref 12.0–16.0)
MCHC: 32.9 g/dL (ref 32.0–36.0)
Platelet: 392 10*3/uL (ref 150–440)
RDW: 14 % (ref 11.5–14.5)

## 2012-10-11 LAB — TROPONIN I: Troponin-I: <0.02 ng/mL

## 2012-10-17 ENCOUNTER — Ambulatory Visit: Payer: Managed Care, Other (non HMO) | Admitting: Cardiovascular Disease

## 2012-11-14 ENCOUNTER — Ambulatory Visit: Payer: Managed Care, Other (non HMO) | Admitting: Cardiovascular Disease

## 2012-12-05 ENCOUNTER — Emergency Department: Payer: Self-pay | Admitting: Emergency Medicine

## 2012-12-09 ENCOUNTER — Institutional Professional Consult (permissible substitution): Payer: Managed Care, Other (non HMO) | Admitting: Neurology

## 2012-12-09 ENCOUNTER — Encounter: Payer: Self-pay | Admitting: Neurology

## 2012-12-12 ENCOUNTER — Other Ambulatory Visit: Payer: Self-pay | Admitting: Cardiovascular Disease

## 2012-12-12 NOTE — Telephone Encounter (Signed)
Refilled Niaspan sent to [facility].

## 2012-12-14 ENCOUNTER — Emergency Department: Payer: Self-pay | Admitting: Internal Medicine

## 2012-12-20 ENCOUNTER — Encounter (HOSPITAL_COMMUNITY): Payer: Self-pay | Admitting: Pharmacy Technician

## 2012-12-21 ENCOUNTER — Encounter (HOSPITAL_COMMUNITY): Payer: Self-pay | Admitting: *Deleted

## 2012-12-21 NOTE — Progress Notes (Addendum)
Pt denies SOB, and chest pain but states that she sees Dr.  Dossie Arbour as her cardiologist at [facility]. Pt states that she had a chest X-ray at [facility] within the past year. Pt advised to bring rescue inhaler DOS.Pt advised of medications to take DOS with small sips of water;pt advised to stop aspirin and products with aspirin in it such as   indomethacin (INDOCIN) 50 MG capsule.

## 2012-12-22 ENCOUNTER — Ambulatory Visit (HOSPITAL_COMMUNITY): Payer: Managed Care, Other (non HMO) | Admitting: Certified Registered Nurse Anesthetist

## 2012-12-22 ENCOUNTER — Encounter (HOSPITAL_COMMUNITY)
Admission: RE | Disposition: A | Discharge status: Home / Self Care | Payer: Self-pay | Source: Ambulatory Visit | Attending: Neurosurgery

## 2012-12-22 ENCOUNTER — Ambulatory Visit (HOSPITAL_COMMUNITY): Payer: Managed Care, Other (non HMO)

## 2012-12-22 ENCOUNTER — Encounter (HOSPITAL_COMMUNITY): Payer: Self-pay | Admitting: Certified Registered Nurse Anesthetist

## 2012-12-22 ENCOUNTER — Encounter (HOSPITAL_COMMUNITY): Payer: Self-pay | Admitting: *Deleted

## 2012-12-22 ENCOUNTER — Ambulatory Visit (HOSPITAL_COMMUNITY)
Admission: RE | Admit: 2012-12-22 | Discharge: 2012-12-24 | Disposition: A | Discharge status: Home / Self Care | Payer: Managed Care, Other (non HMO) | Source: Ambulatory Visit | Attending: Neurosurgery | Admitting: Neurosurgery

## 2012-12-22 DIAGNOSIS — M47817 Spondylosis without myelopathy or radiculopathy, lumbosacral region: Secondary | ICD-10-CM | POA: Insufficient documentation

## 2012-12-22 DIAGNOSIS — M4716 Other spondylosis with myelopathy, lumbar region: Secondary | ICD-10-CM

## 2012-12-22 DIAGNOSIS — I10 Essential (primary) hypertension: Secondary | ICD-10-CM | POA: Insufficient documentation

## 2012-12-22 DIAGNOSIS — E119 Type 2 diabetes mellitus without complications: Secondary | ICD-10-CM | POA: Insufficient documentation

## 2012-12-22 DIAGNOSIS — Z79899 Other long term (current) drug therapy: Secondary | ICD-10-CM | POA: Insufficient documentation

## 2012-12-22 DIAGNOSIS — D1779 Benign lipomatous neoplasm of other sites: Secondary | ICD-10-CM | POA: Insufficient documentation

## 2012-12-22 DIAGNOSIS — J438 Other emphysema: Secondary | ICD-10-CM | POA: Insufficient documentation

## 2012-12-22 DIAGNOSIS — J449 Chronic obstructive pulmonary disease, unspecified: Secondary | ICD-10-CM | POA: Insufficient documentation

## 2012-12-22 DIAGNOSIS — J4489 Other specified chronic obstructive pulmonary disease: Secondary | ICD-10-CM | POA: Insufficient documentation

## 2012-12-22 HISTORY — DX: Adverse effect of unspecified anesthetic, initial encounter: T41.45XA

## 2012-12-22 HISTORY — DX: Shortness of breath: R06.02

## 2012-12-22 HISTORY — DX: Chronic obstructive pulmonary disease, unspecified: J44.9

## 2012-12-22 HISTORY — DX: Other complications of anesthesia, initial encounter: T88.59XA

## 2012-12-22 HISTORY — DX: Anxiety disorder, unspecified: F41.9

## 2012-12-22 HISTORY — DX: Angina pectoris, unspecified: I20.9

## 2012-12-22 HISTORY — DX: Major depressive disorder, single episode, unspecified: F32.9

## 2012-12-22 HISTORY — DX: Unspecified asthma, uncomplicated: J45.909

## 2012-12-22 HISTORY — DX: Emphysema, unspecified: J43.9

## 2012-12-22 HISTORY — DX: Myoneural disorder, unspecified: G70.9

## 2012-12-22 HISTORY — PX: LUMBAR LAMINECTOMY/DECOMPRESSION MICRODISCECTOMY: SHX5026

## 2012-12-22 HISTORY — DX: Depression, unspecified: F32.A

## 2012-12-22 HISTORY — DX: Disease of blood and blood-forming organs, unspecified: D75.9

## 2012-12-22 LAB — GLUCOSE, CAPILLARY
Glucose-Capillary: 286 mg/dL — ABNORMAL HIGH (ref 70–99)
Glucose-Capillary: 245 mg/dL — ABNORMAL HIGH (ref 70–99)
Glucose-Capillary: 125 mg/dL — ABNORMAL HIGH (ref 70–99)
Glucose-Capillary: 90 mg/dL (ref 70–99)
Glucose-Capillary: 98 mg/dL (ref 70–99)

## 2012-12-22 LAB — BASIC METABOLIC PANEL
CO2: 18 mEq/L — ABNORMAL LOW (ref 19–32)
Chloride: 99 mEq/L (ref 96–112)
GFR calc non Af Amer: 52 mL/min — ABNORMAL LOW (ref >90)
Glucose, Bld: 312 mg/dL — ABNORMAL HIGH (ref 70–99)
Potassium: 5.2 mEq/L — ABNORMAL HIGH (ref 3.5–5.1)
Sodium: 134 mEq/L — ABNORMAL LOW (ref 135–145)

## 2012-12-22 LAB — CBC
HCT: 38 % (ref 36.0–46.0)
Hemoglobin: 12.2 g/dL (ref 12.0–15.0)
MCH: 29.8 pg (ref 26.0–34.0)
RBC: 4.1 MIL/uL (ref 3.87–5.11)

## 2012-12-22 SURGERY — LUMBAR LAMINECTOMY/DECOMPRESSION MICRODISCECTOMY 1 LEVEL
Anesthesia: General | Site: Back | Wound class: Clean

## 2012-12-22 MED ORDER — ESCITALOPRAM OXALATE 10 MG PO TABS
60.0000 mg | ORAL_TABLET | Freq: Every morning | ORAL | Status: DC
  Administered 2012-12-22 – 2012-12-24 (×3): 60 mg via ORAL
  Filled 2012-12-22 (×3): qty 6
  Filled 2012-12-22: qty 3

## 2012-12-22 MED ORDER — PROPOFOL 10 MG/ML IV BOLUS
INTRAVENOUS | Status: DC | PRN
  Administered 2012-12-22: 200 mg via INTRAVENOUS

## 2012-12-22 MED ORDER — CEFAZOLIN SODIUM-DEXTROSE 2-3 GM-% IV SOLR
2.0000 g | Freq: Three times a day (TID) | INTRAVENOUS | Status: AC
  Administered 2012-12-22 – 2012-12-23 (×2): 2 g via INTRAVENOUS
  Filled 2012-12-22 (×3): qty 50

## 2012-12-22 MED ORDER — ONDANSETRON HCL 4 MG/2ML IJ SOLN: 4.0000 mg | INTRAMUSCULAR | Status: DC | PRN

## 2012-12-22 MED ORDER — ALBUTEROL SULFATE HFA 108 (90 BASE) MCG/ACT IN AERS
2.0000 | INHALATION_SPRAY | Freq: Two times a day (BID) | RESPIRATORY_TRACT | Status: DC | PRN
  Filled 2012-12-22: qty 6.7

## 2012-12-22 MED ORDER — ATORVASTATIN CALCIUM 10 MG PO TABS
10.0000 mg | ORAL_TABLET | Freq: Every day | ORAL | Status: DC
  Administered 2012-12-22 – 2012-12-23 (×2): 10 mg via ORAL
  Filled 2012-12-22 (×3): qty 1

## 2012-12-22 MED ORDER — MIDAZOLAM HCL 5 MG/5ML IJ SOLN: INTRAMUSCULAR | Status: DC | PRN

## 2012-12-22 MED ORDER — ARTIFICIAL TEARS OP OINT
TOPICAL_OINTMENT | OPHTHALMIC | Status: DC | PRN
  Administered 2012-12-22: 1 via OPHTHALMIC

## 2012-12-22 MED ORDER — SPIRONOLACTONE 50 MG PO TABS
50.0000 mg | ORAL_TABLET | Freq: Every day | ORAL | Status: DC
  Filled 2012-12-22: qty 1

## 2012-12-22 MED ORDER — INSULIN ASPART 100 UNIT/ML ~~LOC~~ SOLN
0.0000 [IU] | Freq: Three times a day (TID) | SUBCUTANEOUS | Status: DC
  Administered 2012-12-23 (×2): 3 [IU] via SUBCUTANEOUS
  Administered 2012-12-23: 5 [IU] via SUBCUTANEOUS

## 2012-12-22 MED ORDER — PANTOPRAZOLE SODIUM 40 MG IV SOLR
40.0000 mg | Freq: Every day | INTRAVENOUS | Status: DC
  Administered 2012-12-22 – 2012-12-23 (×2): 40 mg via INTRAVENOUS
  Filled 2012-12-22 (×3): qty 40

## 2012-12-22 MED ORDER — ONDANSETRON HCL 4 MG/2ML IJ SOLN
INTRAMUSCULAR | Status: DC | PRN
  Administered 2012-12-22: 4 mg via INTRAVENOUS

## 2012-12-22 MED ORDER — LIDOCAINE HCL 4 % MT SOLN
OROMUCOSAL | Status: DC | PRN
  Administered 2012-12-22: 4 mL via TOPICAL

## 2012-12-22 MED ORDER — MIRTAZAPINE 45 MG PO TABS
45.0000 mg | ORAL_TABLET | Freq: Every day | ORAL | Status: DC
  Administered 2012-12-22 – 2012-12-23 (×2): 45 mg via ORAL
  Filled 2012-12-22 (×3): qty 1

## 2012-12-22 MED ORDER — HEMOSTATIC AGENTS (NO CHARGE) OPTIME
TOPICAL | Status: DC | PRN
  Administered 2012-12-22: 1 via TOPICAL

## 2012-12-22 MED ORDER — LORATADINE 10 MG PO TABS
10.0000 mg | ORAL_TABLET | Freq: Every day | ORAL | Status: DC
Start: 2012-12-23 — End: 2012-12-24
  Administered 2012-12-23 – 2012-12-24 (×2): 10 mg via ORAL
  Filled 2012-12-22 (×2): qty 1

## 2012-12-22 MED ORDER — CYCLOBENZAPRINE HCL 10 MG PO TABS
10.0000 mg | ORAL_TABLET | Freq: Three times a day (TID) | ORAL | Status: DC | PRN
  Administered 2012-12-22 – 2012-12-23 (×2): 10 mg via ORAL
  Filled 2012-12-22 (×2): qty 1

## 2012-12-22 MED ORDER — SODIUM CHLORIDE 0.9 % IV SOLN
INTRAVENOUS | Status: AC
  Filled 2012-12-22: qty 500

## 2012-12-22 MED ORDER — HYDROCODONE-ACETAMINOPHEN 5-325 MG PO TABS: 1.0000 | ORAL_TABLET | ORAL | Status: DC | PRN

## 2012-12-22 MED ORDER — 0.9 % SODIUM CHLORIDE (POUR BTL) OPTIME
TOPICAL | Status: DC | PRN
  Administered 2012-12-22: 1000 mL

## 2012-12-22 MED ORDER — ONDANSETRON HCL 4 MG/2ML IJ SOLN: 4.0000 mg | Freq: Once | INTRAMUSCULAR | Status: DC | PRN

## 2012-12-22 MED ORDER — BACITRACIN 50000 UNITS IM SOLR
INTRAMUSCULAR | Status: AC
  Filled 2012-12-22: qty 1

## 2012-12-22 MED ORDER — MIDAZOLAM HCL 5 MG/5ML IJ SOLN
INTRAMUSCULAR | Status: DC | PRN
  Administered 2012-12-22: 2 mg via INTRAVENOUS

## 2012-12-22 MED ORDER — MOMETASONE FURO-FORMOTEROL FUM 100-5 MCG/ACT IN AERO
2.0000 | INHALATION_SPRAY | Freq: Two times a day (BID) | RESPIRATORY_TRACT | Status: DC
  Administered 2012-12-22 – 2012-12-24 (×4): 2 via RESPIRATORY_TRACT
  Filled 2012-12-22: qty 8.8

## 2012-12-22 MED ORDER — BACITRACIN ZINC 500 UNIT/GM EX OINT
TOPICAL_OINTMENT | CUTANEOUS | Status: DC | PRN
  Administered 2012-12-22: 1 via TOPICAL

## 2012-12-22 MED ORDER — METOPROLOL TARTRATE 25 MG PO TABS
25.0000 mg | ORAL_TABLET | Freq: Two times a day (BID) | ORAL | Status: DC
  Administered 2012-12-22 – 2012-12-23 (×2): 25 mg via ORAL
  Filled 2012-12-22 (×5): qty 1

## 2012-12-22 MED ORDER — ROCURONIUM BROMIDE 100 MG/10ML IV SOLN
INTRAVENOUS | Status: DC | PRN
  Administered 2012-12-22: 40 mg via INTRAVENOUS
  Administered 2012-12-22: 10 mg via INTRAVENOUS

## 2012-12-22 MED ORDER — THROMBIN 5000 UNITS EX SOLR
CUTANEOUS | Status: DC | PRN
  Administered 2012-12-22 (×2): 5000 [IU] via TOPICAL

## 2012-12-22 MED ORDER — BACITRACIN 50000 UNITS IM SOLR
INTRAMUSCULAR | Status: DC | PRN
  Administered 2012-12-22: 10:00:00

## 2012-12-22 MED ORDER — INSULIN ASPART 100 UNIT/ML ~~LOC~~ SOLN
SUBCUTANEOUS | Status: AC
  Filled 2012-12-22: qty 1

## 2012-12-22 MED ORDER — MUPIROCIN 2 % EX OINT
TOPICAL_OINTMENT | CUTANEOUS | Status: AC
  Filled 2012-12-22: qty 22

## 2012-12-22 MED ORDER — SODIUM CHLORIDE 0.9 % IV SOLN: 250.0000 mL | INTRAVENOUS | Status: DC

## 2012-12-22 MED ORDER — LACTATED RINGERS IV SOLN
INTRAVENOUS | Status: DC | PRN
  Administered 2012-12-22 (×2): via INTRAVENOUS

## 2012-12-22 MED ORDER — MICROFIBRILLAR COLL HEMOSTAT EX PADS
MEDICATED_PAD | CUTANEOUS | Status: DC | PRN
  Administered 2012-12-22: 1 via TOPICAL

## 2012-12-22 MED ORDER — SODIUM CHLORIDE 0.9 % IJ SOLN
3.0000 mL | Freq: Two times a day (BID) | INTRAMUSCULAR | Status: DC
  Administered 2012-12-23 – 2012-12-24 (×2): 3 mL via INTRAVENOUS

## 2012-12-22 MED ORDER — KETOROLAC TROMETHAMINE 30 MG/ML IJ SOLN
30.0000 mg | Freq: Four times a day (QID) | INTRAMUSCULAR | Status: AC
  Administered 2012-12-22 – 2012-12-24 (×8): 30 mg via INTRAVENOUS
  Filled 2012-12-22 (×11): qty 1

## 2012-12-22 MED ORDER — NEOSTIGMINE METHYLSULFATE 1 MG/ML IJ SOLN
INTRAMUSCULAR | Status: DC | PRN
  Administered 2012-12-22: 4 mg via INTRAVENOUS

## 2012-12-22 MED ORDER — NITROGLYCERIN 0.4 MG SL SUBL: 0.4000 mg | SUBLINGUAL_TABLET | SUBLINGUAL | Status: DC | PRN

## 2012-12-22 MED ORDER — GLIMEPIRIDE 4 MG PO TABS
4.0000 mg | ORAL_TABLET | Freq: Two times a day (BID) | ORAL | Status: DC
  Administered 2012-12-22 – 2012-12-24 (×4): 4 mg via ORAL
  Filled 2012-12-22 (×6): qty 1

## 2012-12-22 MED ORDER — ACETAMINOPHEN 325 MG PO TABS: 650.0000 mg | ORAL_TABLET | ORAL | Status: DC | PRN

## 2012-12-22 MED ORDER — INSULIN GLARGINE 100 UNIT/ML ~~LOC~~ SOLN
53.0000 [IU] | Freq: Every morning | SUBCUTANEOUS | Status: DC
  Administered 2012-12-23 – 2012-12-24 (×2): 53 [IU] via SUBCUTANEOUS
  Filled 2012-12-22 (×2): qty 0.53

## 2012-12-22 MED ORDER — POTASSIUM CHLORIDE IN NACL 20-0.45 MEQ/L-% IV SOLN
INTRAVENOUS | Status: DC
  Administered 2012-12-22 – 2012-12-23 (×2): via INTRAVENOUS
  Filled 2012-12-22 (×4): qty 1000

## 2012-12-22 MED ORDER — CALCIUM CHLORIDE 10 % IV SOLN: INTRAVENOUS | Status: DC | PRN

## 2012-12-22 MED ORDER — HYDROMORPHONE HCL PF 1 MG/ML IJ SOLN
0.2500 mg | INTRAMUSCULAR | Status: DC | PRN
  Administered 2012-12-22: 0.5 mg via INTRAVENOUS

## 2012-12-22 MED ORDER — SPIRONOLACTONE 50 MG PO TABS
50.0000 mg | ORAL_TABLET | Freq: Once | ORAL | Status: AC
  Administered 2012-12-22: 50 mg via ORAL
  Filled 2012-12-22: qty 1

## 2012-12-22 MED ORDER — CLOMIPRAMINE HCL 25 MG PO CAPS
100.0000 mg | ORAL_CAPSULE | Freq: Every day | ORAL | Status: DC
  Administered 2012-12-22 – 2012-12-23 (×2): 100 mg via ORAL
  Filled 2012-12-22 (×3): qty 4

## 2012-12-22 MED ORDER — LIDOCAINE HCL (CARDIAC) 20 MG/ML IV SOLN
INTRAVENOUS | Status: DC | PRN
  Administered 2012-12-22: 100 mg via INTRAVENOUS

## 2012-12-22 MED ORDER — ZOLPIDEM TARTRATE 5 MG PO TABS
5.0000 mg | ORAL_TABLET | Freq: Every evening | ORAL | Status: DC | PRN
  Administered 2012-12-22: 5 mg via ORAL
  Filled 2012-12-22: qty 1

## 2012-12-22 MED ORDER — INSULIN ASPART 100 UNIT/ML ~~LOC~~ SOLN: 0.0000 [IU] | Freq: Every day | SUBCUTANEOUS | Status: DC

## 2012-12-22 MED ORDER — HYDROMORPHONE HCL PF 1 MG/ML IJ SOLN
1.0000 mg | INTRAMUSCULAR | Status: DC | PRN
  Administered 2012-12-22 – 2012-12-23 (×2): 1.5 mg via INTRAMUSCULAR
  Filled 2012-12-22 (×2): qty 1
  Filled 2012-12-22: qty 2
  Filled 2012-12-22: qty 1

## 2012-12-22 MED ORDER — FENTANYL CITRATE 0.05 MG/ML IJ SOLN
INTRAMUSCULAR | Status: DC | PRN
  Administered 2012-12-22: 100 ug via INTRAVENOUS
  Administered 2012-12-22: 50 ug via INTRAVENOUS
  Administered 2012-12-22: 100 ug via INTRAVENOUS

## 2012-12-22 MED ORDER — PREGABALIN 75 MG PO CAPS
150.0000 mg | ORAL_CAPSULE | Freq: Three times a day (TID) | ORAL | Status: DC
  Administered 2012-12-22 – 2012-12-24 (×6): 150 mg via ORAL
  Filled 2012-12-22: qty 1
  Filled 2012-12-22 (×3): qty 2
  Filled 2012-12-22: qty 1
  Filled 2012-12-22 (×2): qty 2

## 2012-12-22 MED ORDER — ACETAMINOPHEN 650 MG RE SUPP: 650.0000 mg | RECTAL | Status: DC | PRN

## 2012-12-22 MED ORDER — TIOTROPIUM BROMIDE MONOHYDRATE 18 MCG IN CAPS
18.0000 ug | ORAL_CAPSULE | Freq: Every day | RESPIRATORY_TRACT | Status: DC
  Administered 2012-12-24: 18 ug via RESPIRATORY_TRACT
  Filled 2012-12-22: qty 5

## 2012-12-22 MED ORDER — INDOMETHACIN 50 MG PO CAPS
50.0000 mg | ORAL_CAPSULE | Freq: Two times a day (BID) | ORAL | Status: DC | PRN
  Filled 2012-12-22: qty 1

## 2012-12-22 MED ORDER — INSULIN ASPART 100 UNIT/ML ~~LOC~~ SOLN
4.0000 [IU] | Freq: Three times a day (TID) | SUBCUTANEOUS | Status: DC
  Administered 2012-12-23: 4 [IU] via SUBCUTANEOUS

## 2012-12-22 MED ORDER — MUPIROCIN 2 % EX OINT: TOPICAL_OINTMENT | Freq: Two times a day (BID) | CUTANEOUS | Status: DC

## 2012-12-22 MED ORDER — KETOROLAC TROMETHAMINE 30 MG/ML IJ SOLN
INTRAMUSCULAR | Status: DC | PRN
  Administered 2012-12-22: 30 mg via INTRAVENOUS

## 2012-12-22 MED ORDER — PHENOL 1.4 % MT LIQD: 1.0000 | OROMUCOSAL | Status: DC | PRN

## 2012-12-22 MED ORDER — LORAZEPAM 1 MG PO TABS
2.0000 mg | ORAL_TABLET | Freq: Four times a day (QID) | ORAL | Status: DC | PRN
Start: 2012-12-22 — End: 2012-12-24

## 2012-12-22 MED ORDER — BUPIVACAINE HCL (PF) 0.5 % IJ SOLN
INTRAMUSCULAR | Status: DC | PRN
  Administered 2012-12-22: 20 mL

## 2012-12-22 MED ORDER — CALCIUM CHLORIDE 10 % IV SOLN
INTRAVENOUS | Status: DC | PRN
  Administered 2012-12-22 (×6): 100 mg via INTRAVENOUS

## 2012-12-22 MED ORDER — GLYCOPYRROLATE 0.2 MG/ML IJ SOLN
INTRAMUSCULAR | Status: DC | PRN
  Administered 2012-12-22: 0.6 mg via INTRAVENOUS

## 2012-12-22 MED ORDER — PHENYLEPHRINE HCL 10 MG/ML IJ SOLN
INTRAMUSCULAR | Status: DC | PRN
Start: 2012-12-22 — End: 2012-12-22
  Administered 2012-12-22: 80 ug via INTRAVENOUS
  Administered 2012-12-22: 40 ug via INTRAVENOUS
  Administered 2012-12-22: 120 ug via INTRAVENOUS
  Administered 2012-12-22 (×2): 80 ug via INTRAVENOUS

## 2012-12-22 MED ORDER — HYDROMORPHONE HCL PF 1 MG/ML IJ SOLN
INTRAMUSCULAR | Status: AC
  Filled 2012-12-22: qty 1

## 2012-12-22 MED ORDER — INSULIN ASPART 100 UNIT/ML ~~LOC~~ SOLN: 14.0000 [IU] | Freq: Three times a day (TID) | SUBCUTANEOUS | Status: DC

## 2012-12-22 MED ORDER — MENTHOL 3 MG MT LOZG: 1.0000 | LOZENGE | OROMUCOSAL | Status: DC | PRN

## 2012-12-22 MED ORDER — INSULIN ASPART 100 UNIT/ML ~~LOC~~ SOLN
SUBCUTANEOUS | Status: DC | PRN
  Administered 2012-12-22: 6 [IU] via SUBCUTANEOUS
  Administered 2012-12-22: 6 [IU] via INTRAVENOUS

## 2012-12-22 MED ORDER — DIPHENHYDRAMINE HCL 50 MG/ML IJ SOLN
INTRAMUSCULAR | Status: DC | PRN
  Administered 2012-12-22: 12.5 mg via INTRAVENOUS

## 2012-12-22 MED ORDER — SODIUM CHLORIDE 0.9 % IJ SOLN: 3.0000 mL | INTRAMUSCULAR | Status: DC | PRN

## 2012-12-22 MED ORDER — CEFAZOLIN SODIUM-DEXTROSE 2-3 GM-% IV SOLR
INTRAVENOUS | Status: AC
  Administered 2012-12-22: 2 g via INTRAVENOUS
  Filled 2012-12-22: qty 50

## 2012-12-22 MED ORDER — FUROSEMIDE 20 MG PO TABS
20.0000 mg | ORAL_TABLET | Freq: Every day | ORAL | Status: DC
  Administered 2012-12-22 – 2012-12-23 (×2): 20 mg via ORAL
  Filled 2012-12-22 (×3): qty 1

## 2012-12-22 SURGICAL SUPPLY — 51 items
ADH SKN CLS APL DERMABOND .7 (GAUZE/BANDAGES/DRESSINGS) ×1
APL SKNCLS STERI-STRIP NONHPOA (GAUZE/BANDAGES/DRESSINGS) ×1
APL SRG 60D 8 XTD TIP BNDBL (TIP) ×1
BAG DECANTER FOR FLEXI CONT (MISCELLANEOUS) ×2 IMPLANT
BENZOIN TINCTURE PRP APPL 2/3 (GAUZE/BANDAGES/DRESSINGS) ×3 IMPLANT
BLADE SURG ROTATE 9660 (MISCELLANEOUS) ×2 IMPLANT
BRUSH SCRUB EZ PLAIN DRY (MISCELLANEOUS) ×2 IMPLANT
BUR CUTTER 7.0 ROUND (BURR) ×2 IMPLANT
BUR MATCHSTICK NEURO 3.0 LAGG (BURR) ×2 IMPLANT
CANISTER SUCTION 2500CC (MISCELLANEOUS) ×2 IMPLANT
CLOTH BEACON ORANGE TIMEOUT ST (SAFETY) ×2 IMPLANT
CONT SPEC 4OZ CLIKSEAL STRL BL (MISCELLANEOUS) ×2 IMPLANT
DERMABOND ADVANCED (GAUZE/BANDAGES/DRESSINGS) ×1
DERMABOND ADVANCED .7 DNX12 (GAUZE/BANDAGES/DRESSINGS) ×1 IMPLANT
DRAPE LAPAROTOMY 100X72X124 (DRAPES) ×2 IMPLANT
DRAPE MICROSCOPE ZEISS OPMI (DRAPES) ×2 IMPLANT
DRAPE SURG 17X23 STRL (DRAPES) ×4 IMPLANT
DRESSING TELFA 8X3 (GAUZE/BANDAGES/DRESSINGS) ×2 IMPLANT
DURAPREP 26ML APPLICATOR (WOUND CARE) ×2 IMPLANT
DURASEAL APPLICATOR TIP (TIP) ×1 IMPLANT
DURASEAL SPINE SEALANT 3ML (MISCELLANEOUS) ×1 IMPLANT
ELECT REM PT RETURN 9FT ADLT (ELECTROSURGICAL) ×2
ELECTRODE REM PT RTRN 9FT ADLT (ELECTROSURGICAL) ×1 IMPLANT
GAUZE SPONGE 4X4 16PLY XRAY LF (GAUZE/BANDAGES/DRESSINGS) IMPLANT
GLOVE BIOGEL PI IND STRL 8.5 (GLOVE) IMPLANT
GLOVE BIOGEL PI INDICATOR 8.5 (GLOVE) ×1
GLOVE ECLIPSE 7.5 STRL STRAW (GLOVE) ×3 IMPLANT
GLOVE SURG SS PI 8.0 STRL IVOR (GLOVE) ×2 IMPLANT
GOWN BRE IMP SLV AUR LG STRL (GOWN DISPOSABLE) ×1 IMPLANT
GOWN BRE IMP SLV AUR XL STRL (GOWN DISPOSABLE) ×2 IMPLANT
GOWN STRL REIN 2XL LVL4 (GOWN DISPOSABLE) ×1 IMPLANT
HEMOSTAT SURGICEL 2X14 (HEMOSTASIS) ×1 IMPLANT
KIT BASIN OR (CUSTOM PROCEDURE TRAY) ×2 IMPLANT
KIT ROOM TURNOVER OR (KITS) ×2 IMPLANT
NDL SPNL 22GX3.5 QUINCKE BK (NEEDLE) ×2 IMPLANT
NEEDLE HYPO 22GX1.5 SAFETY (NEEDLE) ×2 IMPLANT
NEEDLE SPNL 22GX3.5 QUINCKE BK (NEEDLE) ×4 IMPLANT
NS IRRIG 1000ML POUR BTL (IV SOLUTION) ×2 IMPLANT
PACK LAMINECTOMY NEURO (CUSTOM PROCEDURE TRAY) ×2 IMPLANT
PAD ARMBOARD 7.5X6 YLW CONV (MISCELLANEOUS) ×6 IMPLANT
PATTIES SURGICAL .75X.75 (GAUZE/BANDAGES/DRESSINGS) ×2 IMPLANT
RUBBERBAND STERILE (MISCELLANEOUS) ×4 IMPLANT
SPONGE GAUZE 4X4 12PLY (GAUZE/BANDAGES/DRESSINGS) ×2 IMPLANT
SPONGE SURGIFOAM ABS GEL SZ50 (HEMOSTASIS) ×2 IMPLANT
STRIP CLOSURE SKIN 1/2X4 (GAUZE/BANDAGES/DRESSINGS) ×2 IMPLANT
SUT VIC AB 2-0 OS6 18 (SUTURE) ×8 IMPLANT
SUT VIC AB 3-0 CP2 18 (SUTURE) ×2 IMPLANT
SYR 20ML ECCENTRIC (SYRINGE) ×2 IMPLANT
TOWEL OR 17X24 6PK STRL BLUE (TOWEL DISPOSABLE) ×2 IMPLANT
TOWEL OR 17X26 10 PK STRL BLUE (TOWEL DISPOSABLE) ×2 IMPLANT
WATER STERILE IRR 1000ML POUR (IV SOLUTION) ×2 IMPLANT

## 2012-12-22 NOTE — H&P (Signed)
Anna Pena is an 47 y.o. female.   Chief Complaint: Bilateral leg pain HPI: The patient is a 47 year old female was evaluated in the office for bilateral lower extremity discomfort. She is on conservative therapy without improvement underwent imaging studies which showed stenosis and L5-S1 secondary to epidural lipomatosis. Of note is the fact that the patient had some respiratory issues back in the fall and has been on some chronic steroid medication since that time. After failing additional conservative therapy the options were discussed the patient requested surgery now comes for a bilateral decompression L5-S1. I had a long discussion with her regarding the risks and benefits of surgical intervention. The risks discussed include but are not limited to bleeding infection weakness and paralysis spinal fluid leakage persistent pain, and death. We have discussed alternative methods of therapy offered risks and benefits of nonintervention. She's had the opportunity numerous questions and appears to understand. With this information in hand she has requested we proceed with surgery.  Past Medical History  Diagnosis Date  . Hypertension   . Anxiety and depression   . Diabetes mellitus   . CAD (coronary artery disease)     diffuse, multivessel, small vessel disease  . Pneumonia 2011    hospital acquired  . Heart murmur     no echo  . Hx: UTI (urinary tract infection)   . Anemia     with IV iron treatment  . Migraines   . History of kidney stones   . GERD (gastroesophageal reflux disease)   . Menopausal state     not on hormones  . Vitamin D deficiency   . Chronic back pain   . Dizziness     chronic, of ? etiology- with 1 ENT and 2 neuro work ups  . Iron deficiency     dx'd from bone marrow biopsy  . High blood pressure     Readings  . Hyperlipidemia     triglycerides; likely familial  . Hx MRSA infection   . Interstitial lung disease   . Complication of anesthesia     Hx: of  "panic" after arousal  . Anginal pain     Hx: of  . Anxiety   . Depression   . COPD (chronic obstructive pulmonary disease)   . Emphysema     Hx: of  . Shortness of breath     Hx: of  . Asthma     Hx: of  . Neuromuscular disorder     Hx: of neuropathy  . Blood dyscrasia     Hx: of "rare blood disorder"    Past Surgical History  Procedure Laterality Date  . Cholecystectomy  2006  . Breast biopsy  2005  . Tonsillectomy  1989  . Gyn surgery  2000    hysterectomy- total, for adhesions/ endometriosis  . Cardiac catheterization  2010    multivessel CAD, treated medically  . Bone marrow biopsy  08/2010    normal with low iron stores  . Lung biopsy    . Abdominal hysterectomy      Family History  Problem Relation Age of Onset  . Diabetes Mother   . Hyperlipidemia Mother   . Coronary artery disease Mother     hx of CABG in 38s  . Heart disease Mother     MI age 76, 4 vessel CABG  . Diabetes Father   . Hyperlipidemia Father   . Cancer Neg Hx   . Depression Neg Hx   . Anxiety disorder Neg  Hx   . Heart disease Other     Grandparent  . Hypertension Other     Parent, grandparent, other family member  . Diabetes Other     Grandparent  . Kidney disease Other     Grandparent   Social History:  reports that she quit smoking about 3 years ago. Her smoking use included Cigarettes. She has a 25 pack-year smoking history. She has never used smokeless tobacco. She reports that she does not drink alcohol or use illicit drugs.  Allergies:  Allergies  Allergen Reactions  . Ivp Dye (Iodinated Diagnostic Agents) Shortness Of Breath  . Sumatriptan Shortness Of Breath  . Ciprofloxacin Itching  . Erythromycin Itching  . Exenatide     Not effective   . Sulfonamide Derivatives Itching    Medications Prior to Admission  Medication Sig Dispense Refill  . albuterol (VENTOLIN HFA) 108 (90 BASE) MCG/ACT inhaler Inhale 2 puffs into the lungs 2 (two) times daily as needed for wheezing  or shortness of breath.       Marland Kitchen aspirin 81 MG tablet Take 81 mg by mouth daily.      . cetirizine (ZYRTEC) 10 MG tablet Take 10 mg by mouth daily.      . clomiPRAMINE (ANAFRANIL) 50 MG capsule Take 100 mg by mouth at bedtime. Takes 3 tablets at night.      . Cyanocobalamin (VITAMIN B-12 PO) Take 1 tablet by mouth daily.      Marland Kitchen escitalopram (LEXAPRO) 20 MG tablet Take 60 mg by mouth every morning.       . Fluticasone-Salmeterol (ADVAIR DISKUS) 250-50 MCG/DOSE AEPB Inhale 1 puff into the lungs every 12 (twelve) hours.       . furosemide (LASIX) 20 MG tablet Take 20 mg by mouth daily.      Marland Kitchen glimepiride (AMARYL) 4 MG tablet Take 4 mg by mouth 2 (two) times daily.      . indomethacin (INDOCIN) 50 MG capsule Take 50 mg by mouth 2 (two) times daily as needed (for gout).      . insulin aspart (NOVOLOG) 100 UNIT/ML injection Inject 14 Units into the skin 3 (three) times daily before meals.       . insulin glargine (LANTUS SOLOSTAR) 100 UNIT/ML injection Inject 53 Units into the skin every morning.       Marland Kitchen LORazepam (ATIVAN) 2 MG tablet Take 2 mg by mouth 4 (four) times daily as needed for anxiety.       . metoprolol tartrate (LOPRESSOR) 25 MG tablet Take 1 tablet (25 mg total) by mouth 2 (two) times daily.  180 tablet  3  . mirtazapine (REMERON) 45 MG tablet Take 1 tablet by mouth at bedtime.      . Multiple Vitamin (MULTIVITAMIN) tablet Take 1 tablet by mouth daily.      . mycophenolate (CELLCEPT) 500 MG tablet Take 1,000 mg by mouth 2 (two) times daily.       . niacin (NIASPAN) 750 MG CR tablet Take 1,500 mg by mouth every evening.      . Potassium 99 MG TABS Take 99 mg by mouth daily.      . pregabalin (LYRICA) 150 MG capsule Take 150 mg by mouth 3 (three) times daily.      . rosuvastatin (CRESTOR) 40 MG tablet Take 1 tablet (40 mg total) by mouth daily.  30 tablet  6  . spironolactone (ALDACTONE) 50 MG tablet Take 1 tablet (50 mg total) by mouth daily.  90 tablet  3  . temazepam (RESTORIL) 15 MG  capsule Take 30 mg by mouth at bedtime.       Marland Kitchen tiotropium (SPIRIVA) 18 MCG inhalation capsule Place 18 mcg into inhaler and inhale daily.      Marland Kitchen BAYER CONTOUR NEXT TEST test strip       . nitroGLYCERIN (NITROSTAT) 0.4 MG SL tablet Place 0.4 mg under the tongue every 5 (five) minutes as needed for chest pain. Use as needed        Results for orders placed during the hospital encounter of 12/22/12 (from the past 48 hour(s))  BASIC METABOLIC PANEL     Status: Abnormal   Collection Time    12/22/12  7:20 AM      Result Value Range   Sodium 134 (*) 135 - 145 mEq/L   Potassium 5.2 (*) 3.5 - 5.1 mEq/L   Chloride 99  96 - 112 mEq/L   CO2 18 (*) 19 - 32 mEq/L   Glucose, Bld 312 (*) 70 - 99 mg/dL   BUN 23  6 - 23 mg/dL   Creatinine, Ser 1.61 (*) 0.50 - 1.10 mg/dL   Calcium 9.5  8.4 - 09.6 mg/dL   GFR calc non Af Amer 52 (*) >90 mL/min   GFR calc Af Amer 60 (*) >90 mL/min   Comment:            The eGFR has been calculated     using the CKD EPI equation.     This calculation has not been     validated in all clinical     situations.     eGFR's persistently     <90 mL/min signify     possible Chronic Kidney Disease.  CBC     Status: Abnormal   Collection Time    12/22/12  7:20 AM      Result Value Range   WBC 12.2 (*) 4.0 - 10.5 K/uL   RBC 4.10  3.87 - 5.11 MIL/uL   Hemoglobin 12.2  12.0 - 15.0 g/dL   HCT 04.5  40.9 - 81.1 %   MCV 92.7  78.0 - 100.0 fL   MCH 29.8  26.0 - 34.0 pg   MCHC 32.1  30.0 - 36.0 g/dL   RDW 91.4  78.2 - 95.6 %   Platelets 301  150 - 400 K/uL  GLUCOSE, CAPILLARY     Status: Abnormal   Collection Time    12/22/12  7:20 AM      Result Value Range   Glucose-Capillary 286 (*) 70 - 99 mg/dL  GLUCOSE, CAPILLARY     Status: Abnormal   Collection Time    12/22/12  8:39 AM      Result Value Range   Glucose-Capillary 245 (*) 70 - 99 mg/dL   No results found.  Respiratory: positive for Chronic pulmonary issues treated at [facility]  Blood pressure 108/73,  pulse 84, temperature 98.2 F (36.8 C), temperature source Oral, resp. rate 20, height 5' 5.5" (1.664 m), weight 99.791 kg (220 lb), SpO2 94.00%.  The patient is awake or and oriented. She is no facial asymmetry. Her gait is slow mildly antalgic. Her reflexes are decreased but equal. She is normal strength and sensation. Assessment/Plan Impression is that of epidural lipomatosis at L5-S1. The plan is for an L5-S1 decompressive laminectomy.  Reinaldo Meeker, MD 12/22/2012, 9:10 AM

## 2012-12-22 NOTE — Op Note (Signed)
Preop diagnosis: Epidural lipomatosis with stenosis L5-S1 Postop diagnosis: Same Procedure: L5-S1 decompressive laminectomy with removal of epidural lipomatosis Surgeon: Mikeal Winstanley Assistant: Phoebe Perch  After being placed the prone position the patient's back was prepped and draped in the usual sterile fashion. Localizing x-rays taken prior to incision to identify the appropriate level. Midline incision was made above the spinous processes of L5 and S1. Using Bovie cutting current the incision was carried on the spinous processes. Subperiosteal dissection was then carried out bilaterally on the spinous processes and lamina and subcutaneous tract was placed for exposure. X-ray showed approach the appropriate level. Using the Leksell rongeur spinous processes of L5 and S1 removed. Starting the patient's left side generous laminotomy was performed by removing the inferior one half of the L5 lamina the medial one third of the facet joint and the superior one third of the S1 lamina. Residual bone and ligamentum flavum removed in a piecemeal fashion. Similar decompression was then carried on the opposite side and in the residual midline structures were removed to complete the bilateral decompression. At this time large amounts of epidural fat were noted these were dissected free from the underlying dura and S1 nerve roots until they were well visualized well decompressed. Generous lateral recess stenosis was carried out as well. The dura was found to be markedly thinned. There is a bit of transudation of clear fluid across the dura though there was no hole noted. After irrigating copiously we elected to place a piece of Surgicel the muscle over the area and then hold it in place and sealed off further with fibrin glue. Gelfoam was then placed above this. The was then closed in multiple layers of Vicryl on the muscle fascia subcutaneous and subcuticular tissues. Dermabond Steri-Strips were placed on the skin. A sterile  dressing was then applied the patient was extubated and taken to recovery room in stable condition.

## 2012-12-22 NOTE — Anesthesia Preprocedure Evaluation (Signed)
Anesthesia Evaluation  Patient identified by MRN, date of birth, ID band Patient awake    Reviewed: Allergy & Precautions, H&P , NPO status , Patient's Chart, lab work & pertinent test results  Airway Mallampati: II TM Distance: >3 FB Neck ROM: full    Dental   Pulmonary asthma , COPDformer smoker,          Cardiovascular hypertension, + angina + CAD Rhythm:regular Rate:Normal     Neuro/Psych  Headaches, Anxiety Depression  Neuromuscular disease    GI/Hepatic GERD-  ,  Endo/Other  diabetes, Type 2, Insulin Dependent and Oral Hypoglycemic Agents  Renal/GU      Musculoskeletal  (+) Arthritis -, Fibromyalgia -  Abdominal   Peds  Hematology  (+) Blood dyscrasia, anemia ,   Anesthesia Other Findings   Reproductive/Obstetrics                           Anesthesia Physical Anesthesia Plan  ASA: III  Anesthesia Plan: General   Post-op Pain Management:    Induction: Intravenous  Airway Management Planned: Oral ETT  Additional Equipment:   Intra-op Plan:   Post-operative Plan: Extubation in OR  Informed Consent: I have reviewed the patients History and Physical, chart, labs and discussed the procedure including the risks, benefits and alternatives for the proposed anesthesia with the patient or authorized representative who has indicated his/her understanding and acceptance.     Plan Discussed with: CRNA, Anesthesiologist and Surgeon  Anesthesia Plan Comments:         Anesthesia Quick Evaluation

## 2012-12-22 NOTE — Anesthesia Procedure Notes (Signed)
Procedure Name: Intubation Date/Time: 12/22/2012 9:27 AM Performed by: Orvilla Fus A Pre-anesthesia Checklist: Patient identified, Emergency Drugs available, Suction available, Patient being monitored and Timeout performed Patient Re-evaluated:Patient Re-evaluated prior to inductionOxygen Delivery Method: Circle system utilized Preoxygenation: Pre-oxygenation with 100% oxygen Intubation Type: IV induction Ventilation: Mask ventilation without difficulty and Oral airway inserted - appropriate to patient size Laryngoscope Size: Mac and 3 Grade View: Grade I Tube type: Oral Tube size: 7.0 mm Number of attempts: 1 Airway Equipment and Method: Stylet and LTA kit utilized Placement Confirmation: ETT inserted through vocal cords under direct vision,  positive ETCO2 and breath sounds checked- equal and bilateral Secured at: 21 cm Tube secured with: Tape Dental Injury: Teeth and Oropharynx as per pre-operative assessment

## 2012-12-22 NOTE — Progress Notes (Signed)
Dr. Gerlene Fee paged for orders

## 2012-12-22 NOTE — Progress Notes (Signed)
Clarified with Dr. Gerlene Fee that patient is to be flat in bed, may log roll from side to side, until tomorrow. If patient unable to void via bedpan to insert Foley cathater.  Linley Moskal Charity fundraiser. SCRN

## 2012-12-22 NOTE — Anesthesia Postprocedure Evaluation (Signed)
  Anesthesia Post-op Note  Patient: Anna Pena  Procedure(s) Performed: Procedure(s) with comments: LUMBAR LAMINECTOMY/DECOMPRESSION MICRODISCECTOMY 1 LEVEL (N/A) - Lumbar Laminectomy/Decompression Microdiscectomy Lumbar Five-Sacral One  Patient Location: PACU  Anesthesia Type:General  Level of Consciousness: awake, alert , oriented and patient cooperative  Airway and Oxygen Therapy: Patient Spontanous Breathing  Post-op Pain: mild  Post-op Assessment: Post-op Vital signs reviewed, Patient's Cardiovascular Status Stable, Respiratory Function Stable, Patent Airway, No signs of Nausea or vomiting and Pain level controlled  Post-op Vital Signs: stable  Complications: No apparent anesthesia complications

## 2012-12-22 NOTE — Transfer of Care (Signed)
Immediate Anesthesia Transfer of Care Note  Patient: Anna Pena  Procedure(s) Performed: Procedure(s) with comments: LUMBAR LAMINECTOMY/DECOMPRESSION MICRODISCECTOMY 1 LEVEL (N/A) - Lumbar Laminectomy/Decompression Microdiscectomy Lumbar Five-Sacral One  Patient Location: PACU  Anesthesia Type:General  Level of Consciousness: awake, alert  and oriented  Airway & Oxygen Therapy: Patient Spontanous Breathing and Patient connected to nasal cannula oxygen  Post-op Assessment: Report given to PACU RN, Post -op Vital signs reviewed and stable and Patient moving all extremities  Post vital signs: Reviewed and stable  Complications: No apparent anesthesia complications

## 2012-12-23 ENCOUNTER — Encounter (HOSPITAL_COMMUNITY): Payer: Self-pay | Admitting: Neurosurgery

## 2012-12-23 ENCOUNTER — Inpatient Hospital Stay (HOSPITAL_COMMUNITY): Admission: RE | Admit: 2012-12-23 | Payer: Managed Care, Other (non HMO) | Source: Ambulatory Visit

## 2012-12-23 LAB — GLUCOSE, CAPILLARY
Glucose-Capillary: 201 mg/dL — ABNORMAL HIGH (ref 70–99)
Glucose-Capillary: 177 mg/dL — ABNORMAL HIGH (ref 70–99)
Glucose-Capillary: 199 mg/dL — ABNORMAL HIGH (ref 70–99)

## 2012-12-23 MED ORDER — OXYCODONE-ACETAMINOPHEN 5-325 MG PO TABS: 2.0000 | ORAL_TABLET | ORAL | Status: DC | PRN

## 2012-12-23 NOTE — Progress Notes (Signed)
Patient ID: Anna Pena, female   DOB: [birth year], 47 y.o.   MRN: [id number] Afeb, vss Feels good. Wants to get up. Wound clean and dry.  Will start to increase activity a bit, but alos keeop flat a lot. Plan d/c tomorrow if doing well.

## 2012-12-24 LAB — BASIC METABOLIC PANEL
BUN: 16 mg/dL (ref 6–23)
Calcium: 9.1 mg/dL (ref 8.4–10.5)
Creatinine, Ser: 0.94 mg/dL (ref 0.50–1.10)
GFR calc Af Amer: 82 mL/min — ABNORMAL LOW (ref >90)
GFR calc non Af Amer: 71 mL/min — ABNORMAL LOW (ref >90)

## 2012-12-24 MED ORDER — OXYCODONE-ACETAMINOPHEN 5-325 MG PO TABS: 2.0000 | ORAL_TABLET | ORAL | Status: AC | PRN

## 2012-12-24 MED ORDER — METHOCARBAMOL 500 MG PO TABS: 500.0000 mg | ORAL_TABLET | Freq: Four times a day (QID) | ORAL | Status: AC

## 2012-12-24 MED ORDER — SPIRONOLACTONE 50 MG PO TABS: 50.0000 mg | ORAL_TABLET | Freq: Every day | ORAL | Status: DC

## 2012-12-24 MED ORDER — PANTOPRAZOLE SODIUM 40 MG PO TBEC
40.0000 mg | DELAYED_RELEASE_TABLET | Freq: Every day | ORAL | Status: DC
  Administered 2012-12-24: 40 mg via ORAL
  Filled 2012-12-24: qty 1

## 2012-12-24 NOTE — Discharge Summary (Signed)
Physician Discharge Summary  Patient ID: Anna Pena MRN: [id number] DOB/AGE: October 16, 1965 47 y.o.  Admit date: 12/22/2012 Discharge date: 12/24/2012  Admission Diagnoses: Lumbar spondylosis L5-S1, epidural lipomatosis, lumbar radiculopathy  Discharge Diagnoses: Lumbar spondylosis L5-S1, epidural lipomatosis, lumbar radiculopathy Active Problems:   * No active hospital problems. *   Discharged Condition: good  Hospital Course: Patient was admitted to undergo surgical decompression at L5-S1. During surgery there is evidence of spinal fluid leakage. She was treated with bed rest conservatively postoperatively. She did well. She is discharged home.  Consults: None  Significant Diagnostic Studies: None  Treatments: Lumbar laminectomy decompression of L5 and S1 nerve roots.  Discharge Exam: Blood pressure 86/58, pulse 81, temperature 98.1 F (36.7 C), temperature source Oral, resp. rate 16, height 5' 5.5" (1.664 m), weight 99.791 kg (220 lb), SpO2 94.00%. Motor function is intact patient is ambulatory station and gait are normal  Disposition: 01-Home or Self Care  Discharge Orders   Future Orders Complete By Expires     Call MD for:  redness, tenderness, or signs of infection (pain, swelling, redness, odor or green/yellow discharge around incision site)  As directed     Call MD for:  severe uncontrolled pain  As directed     Call MD for:  temperature >100.4  As directed     Diet - low sodium heart healthy  As directed     Increase activity slowly  As directed         Medication List    TAKE these medications       ADVAIR DISKUS 250-50 MCG/DOSE Aepb  Generic drug:  Fluticasone-Salmeterol  Inhale 1 puff into the lungs every 12 (twelve) hours.     aspirin 81 MG tablet  Take 81 mg by mouth daily.     BAYER CONTOUR NEXT TEST test strip  Generic drug:  glucose blood     cetirizine 10 MG tablet  Commonly known as:  ZYRTEC  Take 10 mg by mouth daily.     clomiPRAMINE 50 MG  capsule  Commonly known as:  ANAFRANIL  Take 100 mg by mouth at bedtime. Takes 3 tablets at night.     escitalopram 20 MG tablet  Commonly known as:  LEXAPRO  Take 60 mg by mouth every morning.     furosemide 20 MG tablet  Commonly known as:  LASIX  Take 20 mg by mouth daily.     glimepiride 4 MG tablet  Commonly known as:  AMARYL  Take 4 mg by mouth 2 (two) times daily.     indomethacin 50 MG capsule  Commonly known as:  INDOCIN  Take 50 mg by mouth 2 (two) times daily as needed (for gout).     LANTUS SOLOSTAR 100 UNIT/ML injection  Generic drug:  insulin glargine  Inject 53 Units into the skin every morning.     LORazepam 2 MG tablet  Commonly known as:  ATIVAN  Take 2 mg by mouth 4 (four) times daily as needed for anxiety.     methocarbamol 500 MG tablet  Commonly known as:  ROBAXIN  Take 1 tablet (500 mg total) by mouth 4 (four) times daily.     metoprolol tartrate 25 MG tablet  Commonly known as:  LOPRESSOR  Take 1 tablet (25 mg total) by mouth 2 (two) times daily.     mirtazapine 45 MG tablet  Commonly known as:  REMERON  Take 1 tablet by mouth at bedtime.     multivitamin tablet  Take 1 tablet by mouth daily.     mycophenolate 500 MG tablet  Commonly known as:  CELLCEPT  Take 1,000 mg by mouth 2 (two) times daily.     niacin 750 MG CR tablet  Commonly known as:  NIASPAN  Take 1,500 mg by mouth every evening.     nitroGLYCERIN 0.4 MG SL tablet  Commonly known as:  NITROSTAT  Place 0.4 mg under the tongue every 5 (five) minutes as needed for chest pain. Use as needed     NOVOLOG 100 UNIT/ML injection  Generic drug:  insulin aspart  Inject 14 Units into the skin 3 (three) times daily before meals.     oxyCODONE-acetaminophen 5-325 MG per tablet  Commonly known as:  PERCOCET/ROXICET  Take 2 tablets by mouth every 4 (four) hours as needed.     Potassium 99 MG Tabs  Take 99 mg by mouth daily.     pregabalin 150 MG capsule  Commonly known as:  LYRICA   Take 150 mg by mouth 3 (three) times daily.     rosuvastatin 40 MG tablet  Commonly known as:  CRESTOR  Take 1 tablet (40 mg total) by mouth daily.     spironolactone 50 MG tablet  Commonly known as:  ALDACTONE  Take 1 tablet (50 mg total) by mouth daily.     temazepam 15 MG capsule  Commonly known as:  RESTORIL  Take 30 mg by mouth at bedtime.     tiotropium 18 MCG inhalation capsule  Commonly known as:  SPIRIVA  Place 18 mcg into inhaler and inhale daily.     VENTOLIN HFA 108 (90 BASE) MCG/ACT inhaler  Generic drug:  albuterol  Inhale 2 puffs into the lungs 2 (two) times daily as needed for wheezing or shortness of breath.     VITAMIN B-12 PO  Take 1 tablet by mouth daily.         SignedStefani Dama 12/24/2012, 10:16 AM

## 2013-01-06 ENCOUNTER — Ambulatory Visit: Payer: Self-pay | Admitting: Physician Assistant

## 2013-01-24 DEATH — deceased

## 2013-11-18 IMAGING — CT CT CHEST W/O CM
1 series · 15 of 32 positions shown, 19 images · non-contrast
Comparison: None

REASON FOR EXAM: persistent infection, dyspnea
COMMENTS:

PROCEDURE:     CT  - CT CHEST WITHOUT CONTRAST  - May 17, 2012 [DATE]
RESULT:     Indication: Dyspnea, infection
TECHNIQUE: Multiple axial images of the chest are obtained without
intravenous contrast.

[Series 2: soft tissue · axial · 0.70mm/px · z∈[-603,-342]mm · 15 of 99 slices shown, 19 images]
[im 8/99  mediastinal]
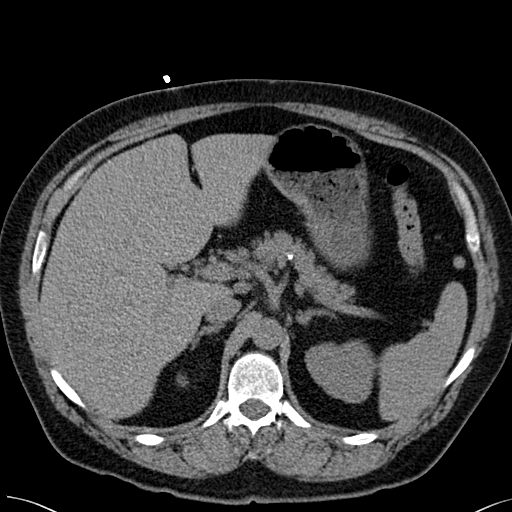
[im 8/99  lung]
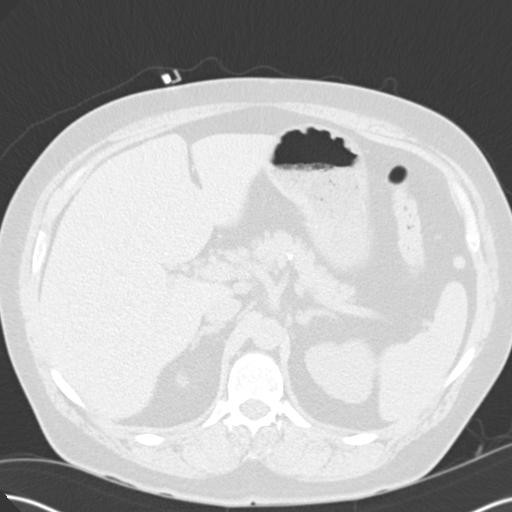
[im 15/99  lung]
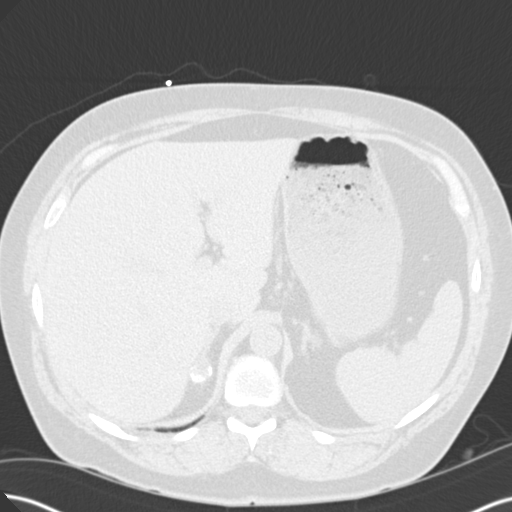
[im 20/99  lung]
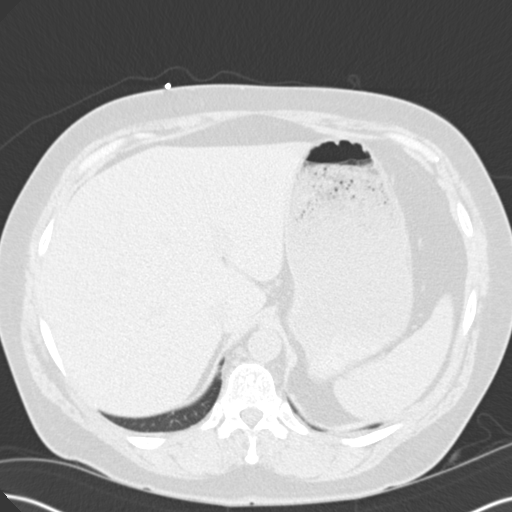
[im 26/99  lung]
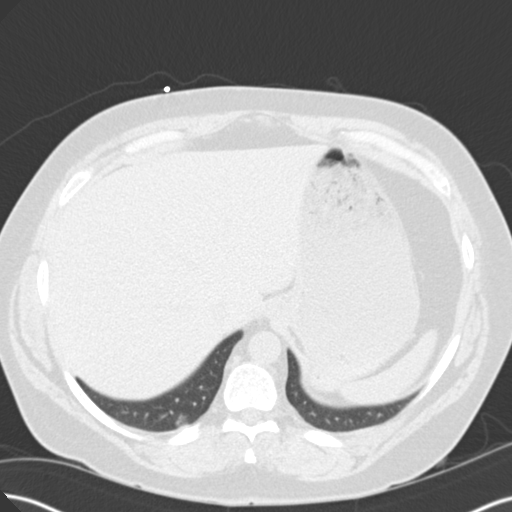
[im 33/99  mediastinal]
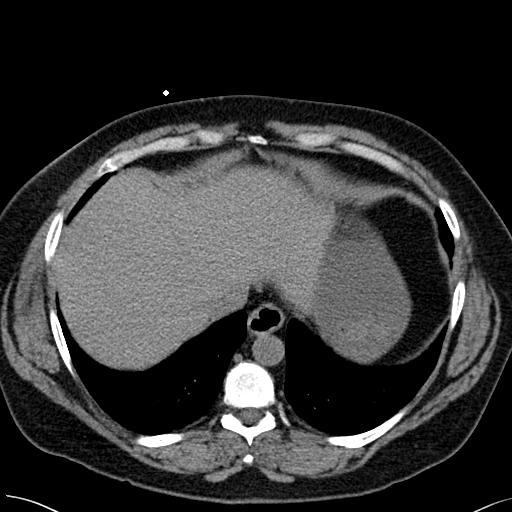
[im 33/99  lung]
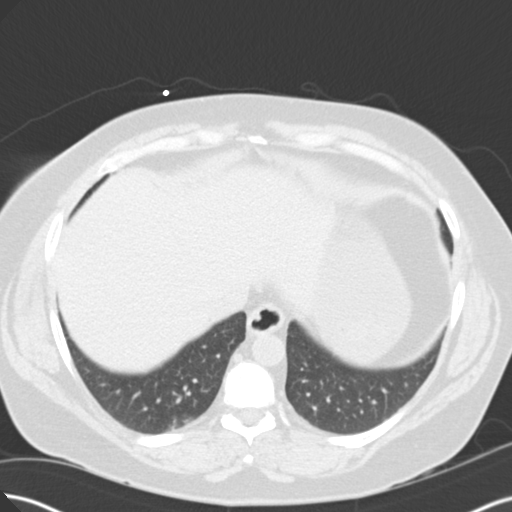
[im 40/99  lung]
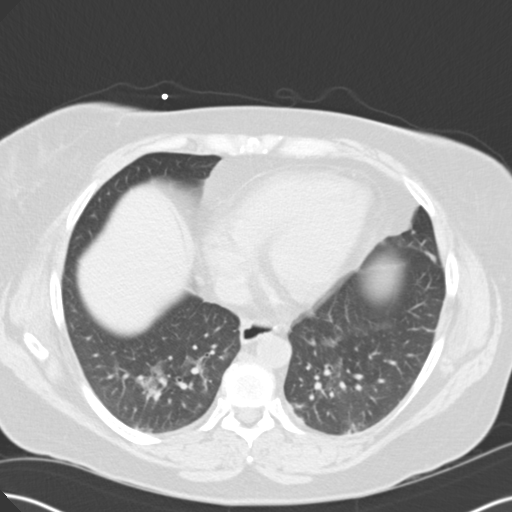
[im 47/99  lung]
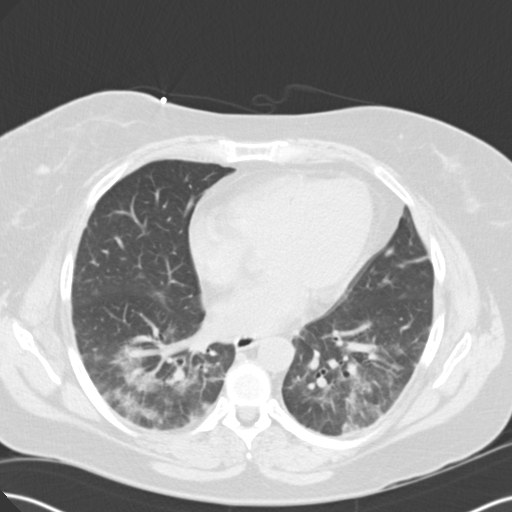
[im 51/99  lung]
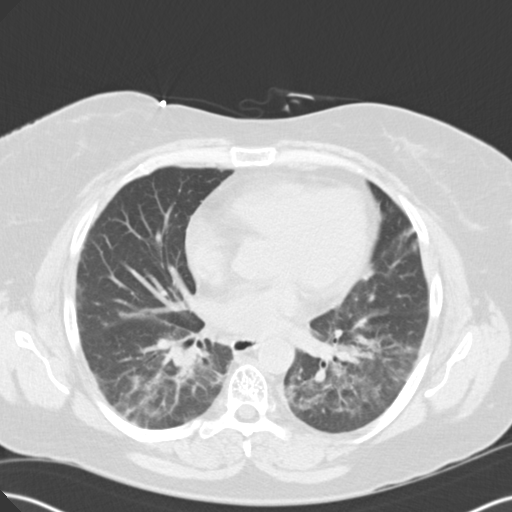
[im 55/99  mediastinal]
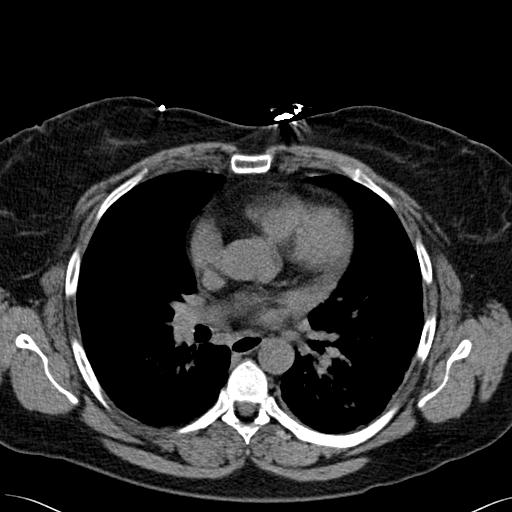
[im 55/99  lung]
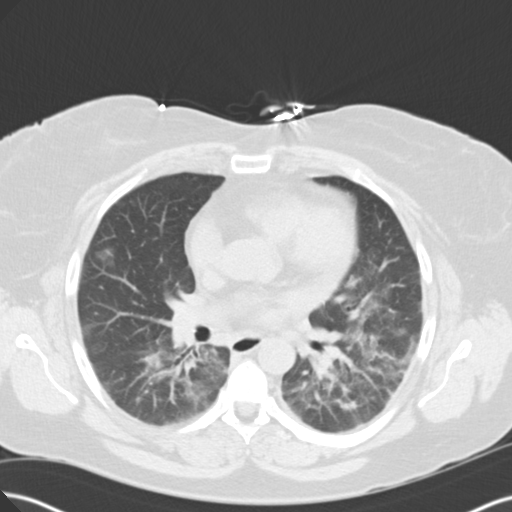
[im 62/99  lung]
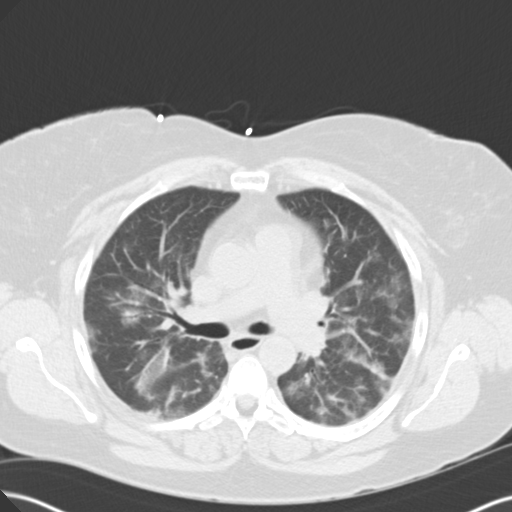
[im 69/99  lung]
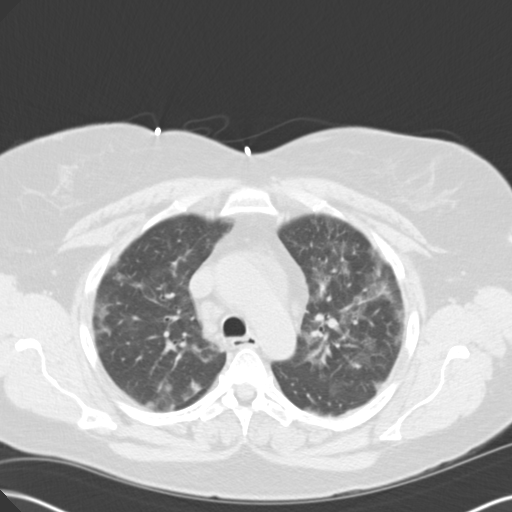
[im 77/99  lung]
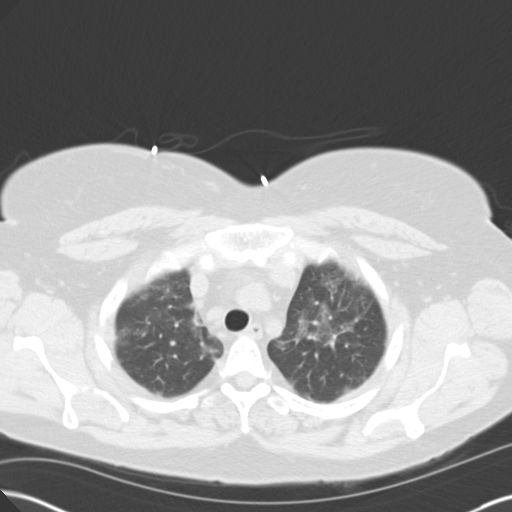
[im 80/99  mediastinal]
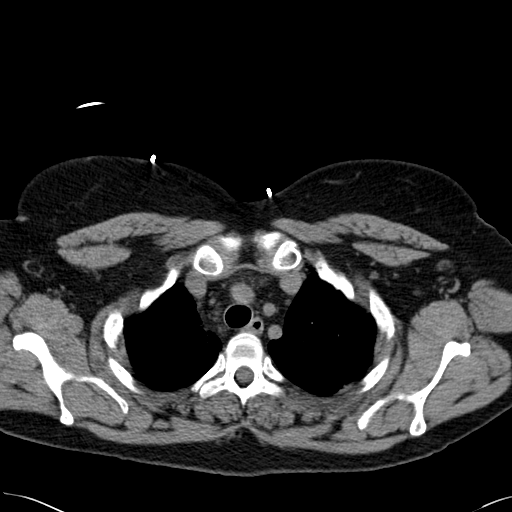
[im 80/99  lung]
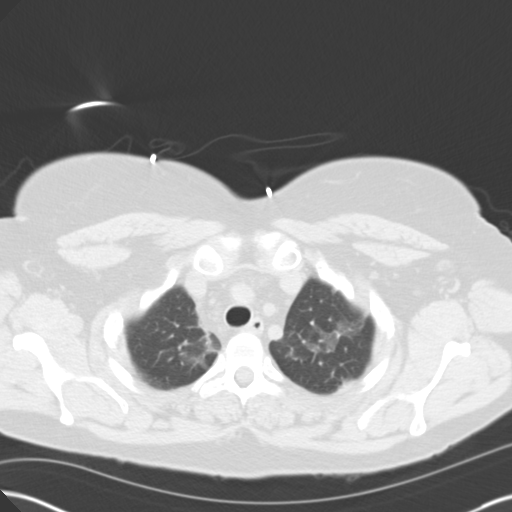
[im 88/99  lung]
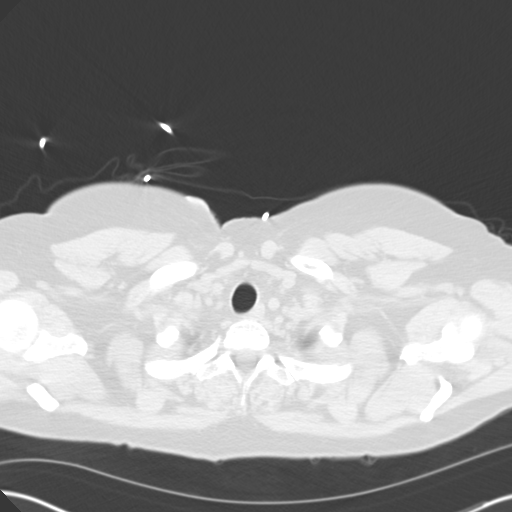
[im 95/99  lung]
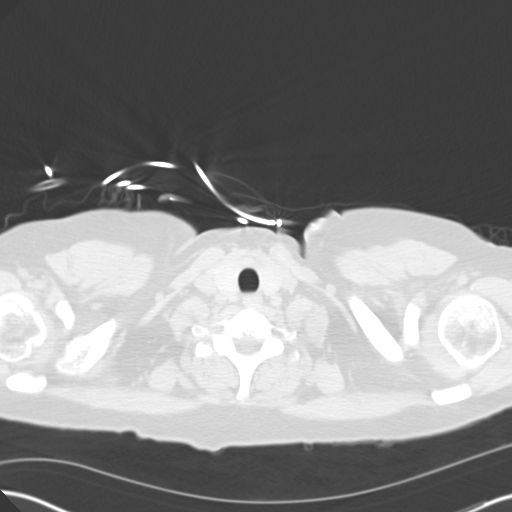

[15 of 32 positions shown; findings below may reference images not displayed]

FINDINGS: The central airways are patent. There bilateral patchy groundglass opacities
and interstitial thickening involving the superior segments of bilateral
lower lobe and the upper lobes. There is a subpleural noncalcified 7 mm in
the right middle lobe. There is no pleural effusion or pneumothorax.

There are no pathologically enlarged axillary, hilar, or mediastinal lymph
nodes.

The heart size is normal. There is no pericardial effusion. The thoracic
aorta is normal in caliber.

Review of bone windows demonstrates no focal lytic or sclerotic lesions.

Limited noncontrast images of the upper abdomen were obtained. There is a
peripherally calcified 2.4 cm right adrenal mass which may resent sequela
prior infection or trauma. The mass has decreased in size compared with
12/26/2004.
IMPRESSION: 1. Patchy interstitial and groundglass opacities in the upper lobes and
superior segment of the lower lobes. Differential considerations include an
infectious or inflammatory etiology including hypersensitivity pneumonitis.

2. There is a 7 mm subpleural right middle lobe pulmonary nodule. Recommend
followup CT chest in 3 months.

[REDACTED]

## 2013-11-18 IMAGING — CR DG CHEST 2V
1 series · 2 of 2 positions shown · non-contrast
Comparison: none

REASON FOR EXAM: difficulty breathing
COMMENTS:   May transport without cardiac monitor

PROCEDURE:     DXR - DXR CHEST PA (OR AP) AND LATERAL  - May 17, 2012  [DATE]
RESULT:     Comparison: 04/27/2012

[Series 1: pa · 0.17mm/px · 2 of 2 slices shown]
[im 1/2]
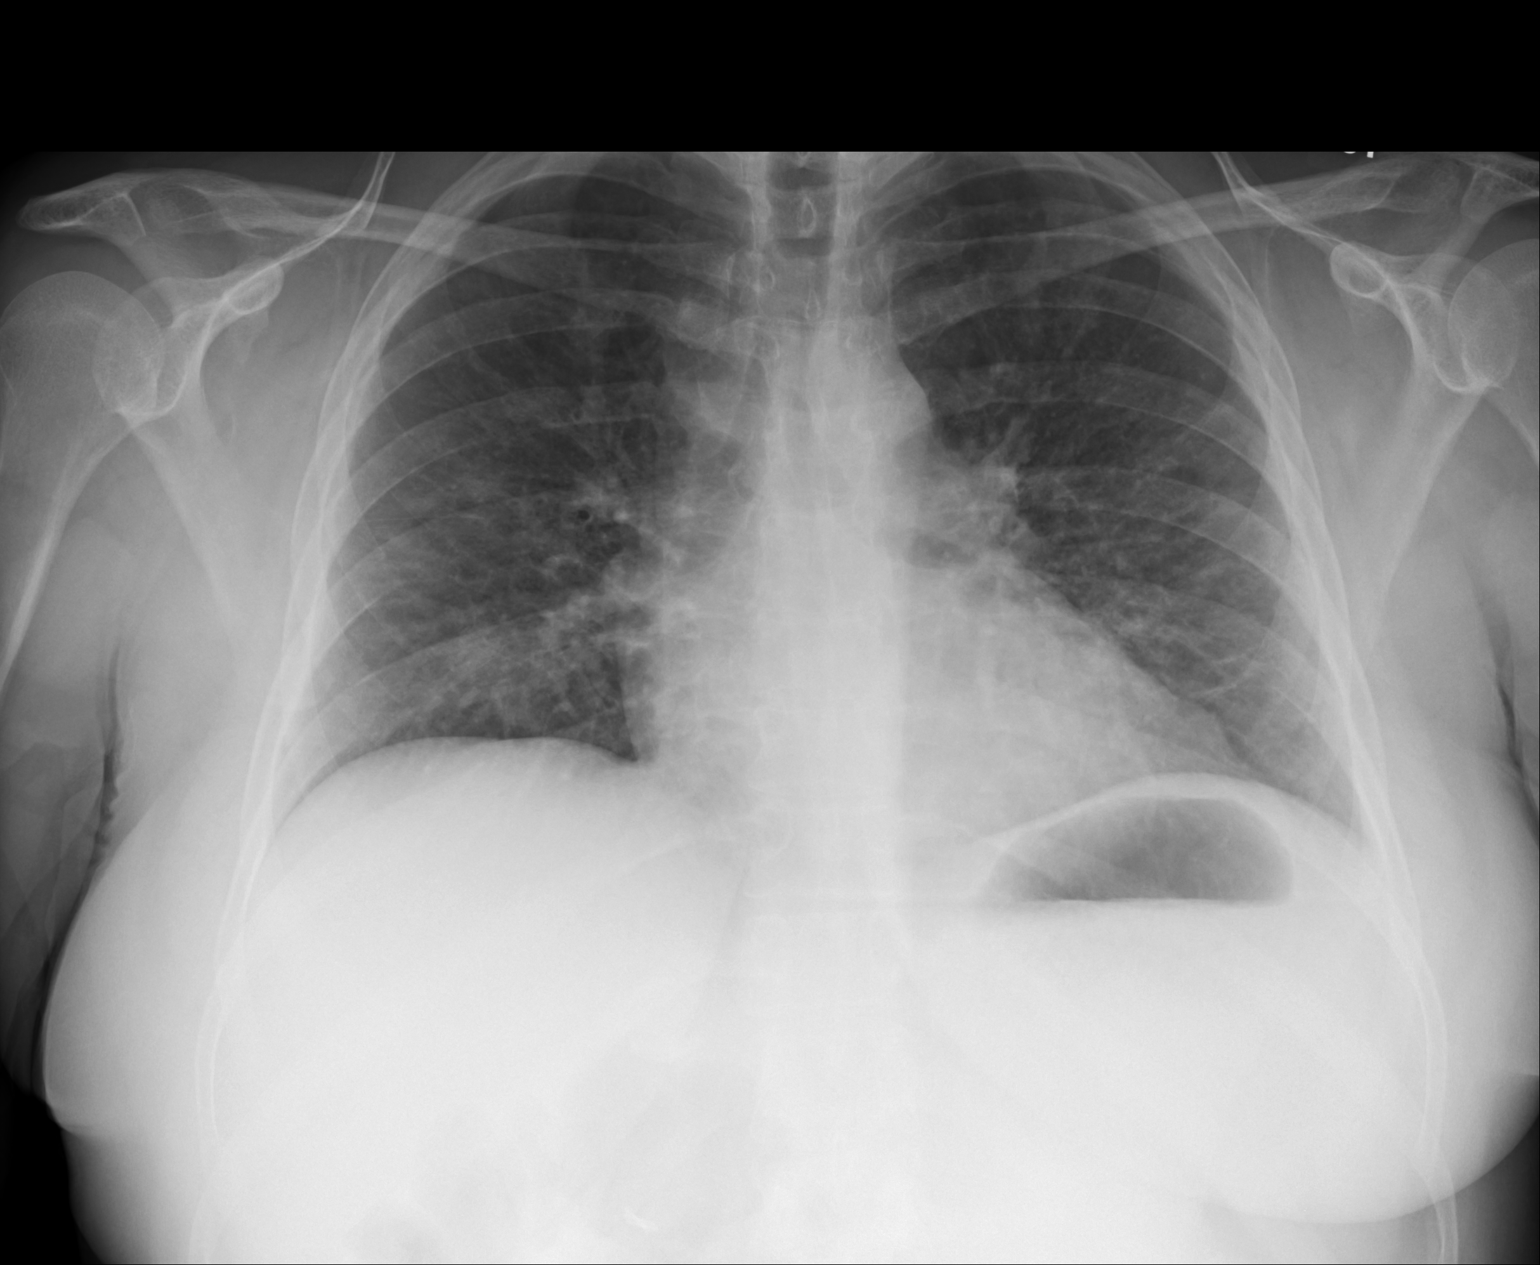
[im 2/2]
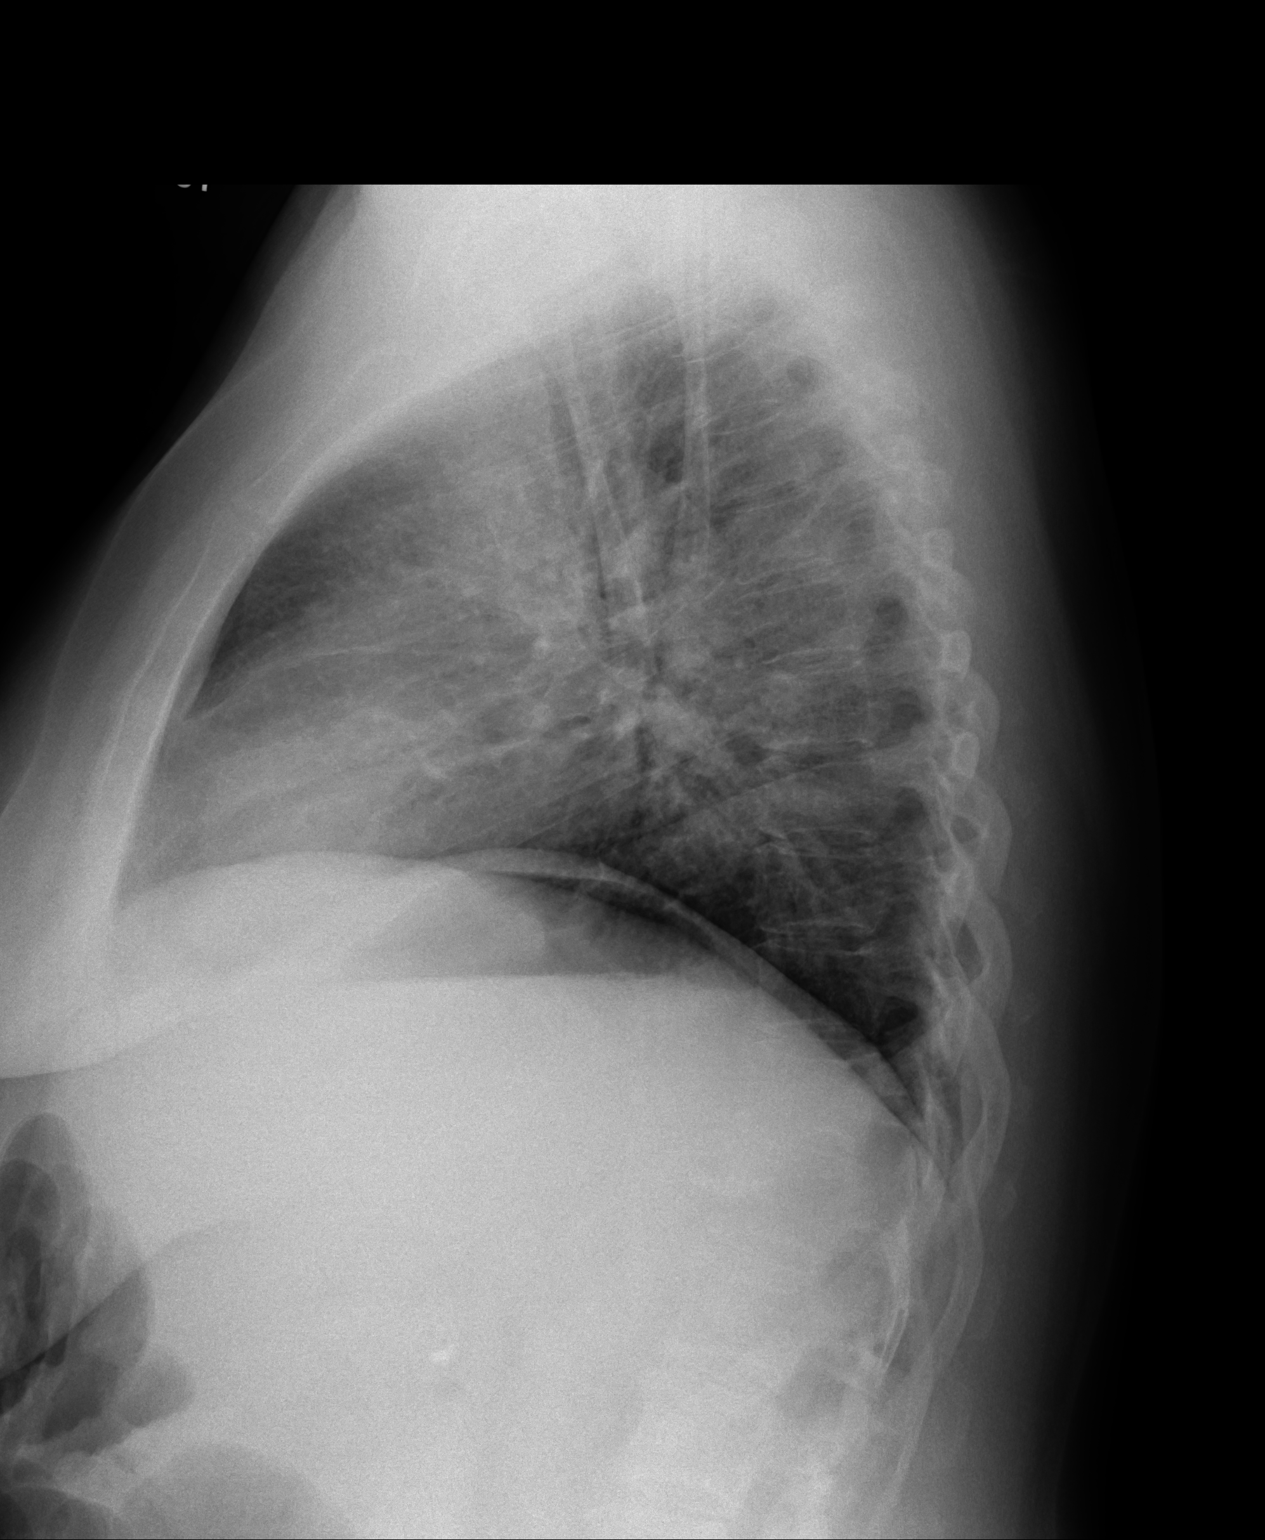

[2 of 2 positions shown; findings below may reference images not displayed]

FINDINGS: PA and lateral chest radiographs are provided. There is bilateral patchy
Interstitial and the lesser degree alveolar airspace opacities which may
secondary to an infectious or inflammatory etiology including
hypersensitivity pneumonitis versus edema. There is no focal consolidation,
pleural effusion, or pneumothorax. The heart and mediastinum are
unremarkable.  The osseous structures are unremarkable.
IMPRESSION: Please see above.

[REDACTED]

## 2013-12-18 IMAGING — CR DG ABDOMEN 1V
1 series · 2 of 2 positions shown · non-contrast
Comparison: none

REASON FOR EXAM: constipation
COMMENTS:

[Series 3: t abdomen supine · 0.14mm/px · 2 of 2 slices shown]
[im 1/2]
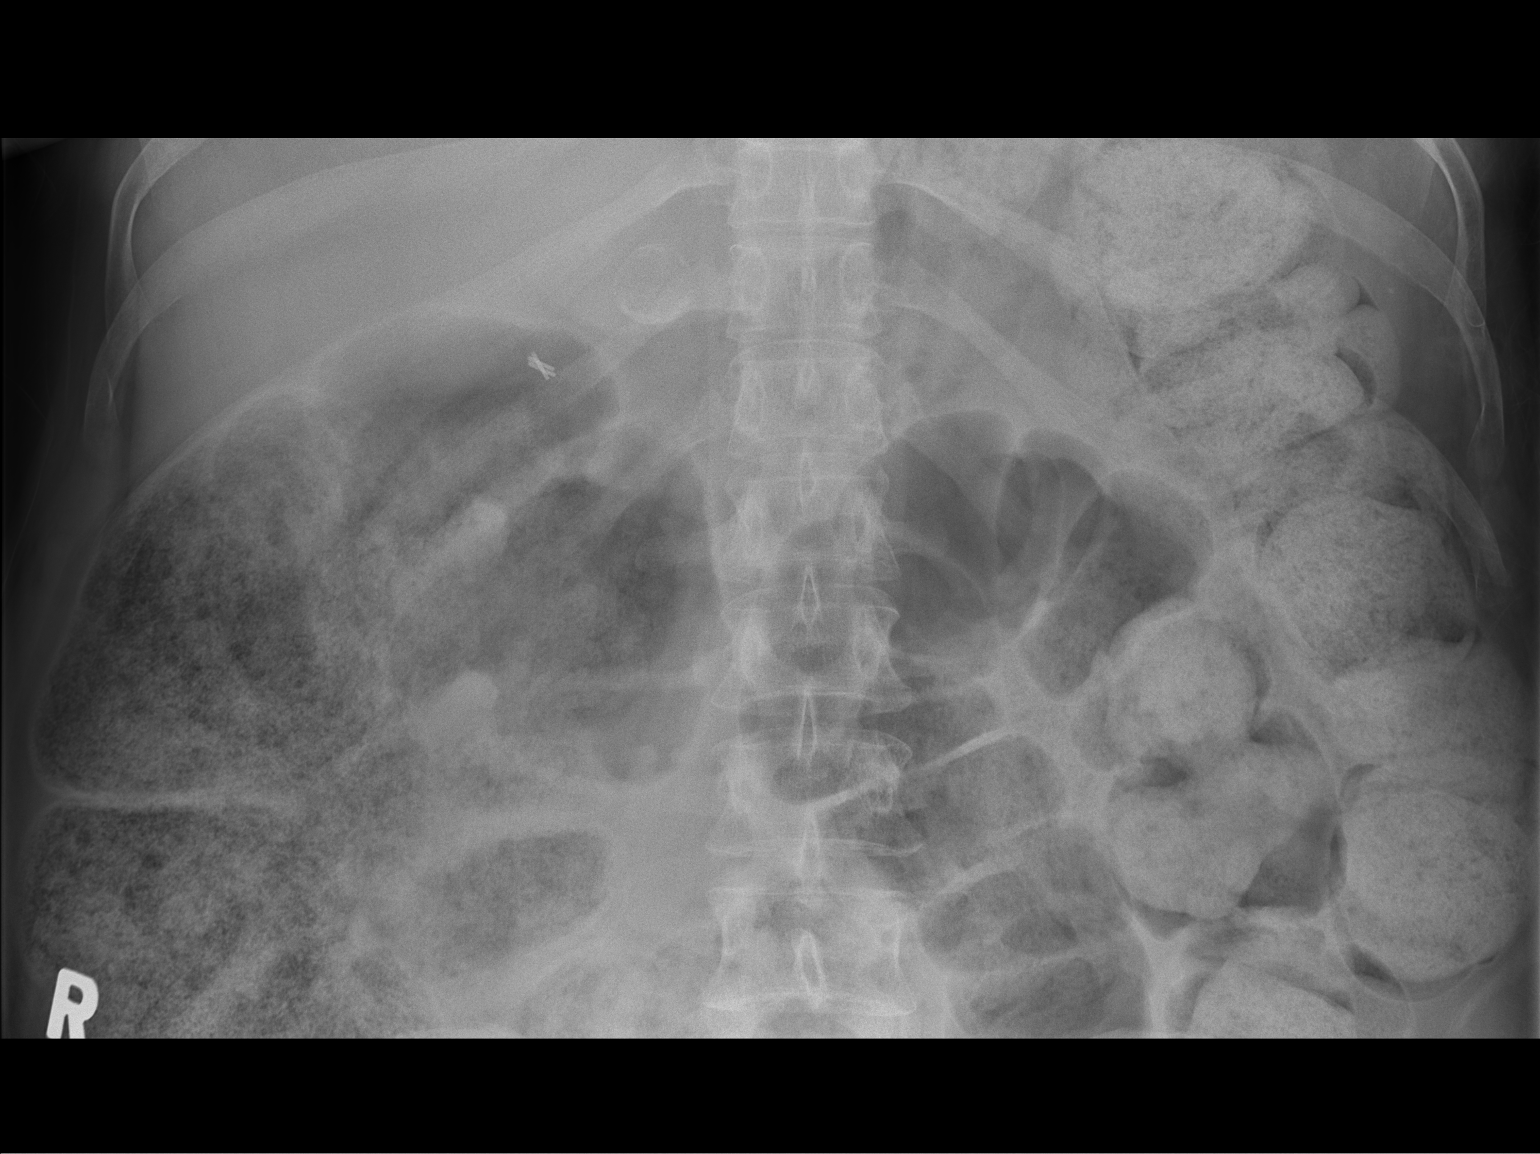
[im 2/2]
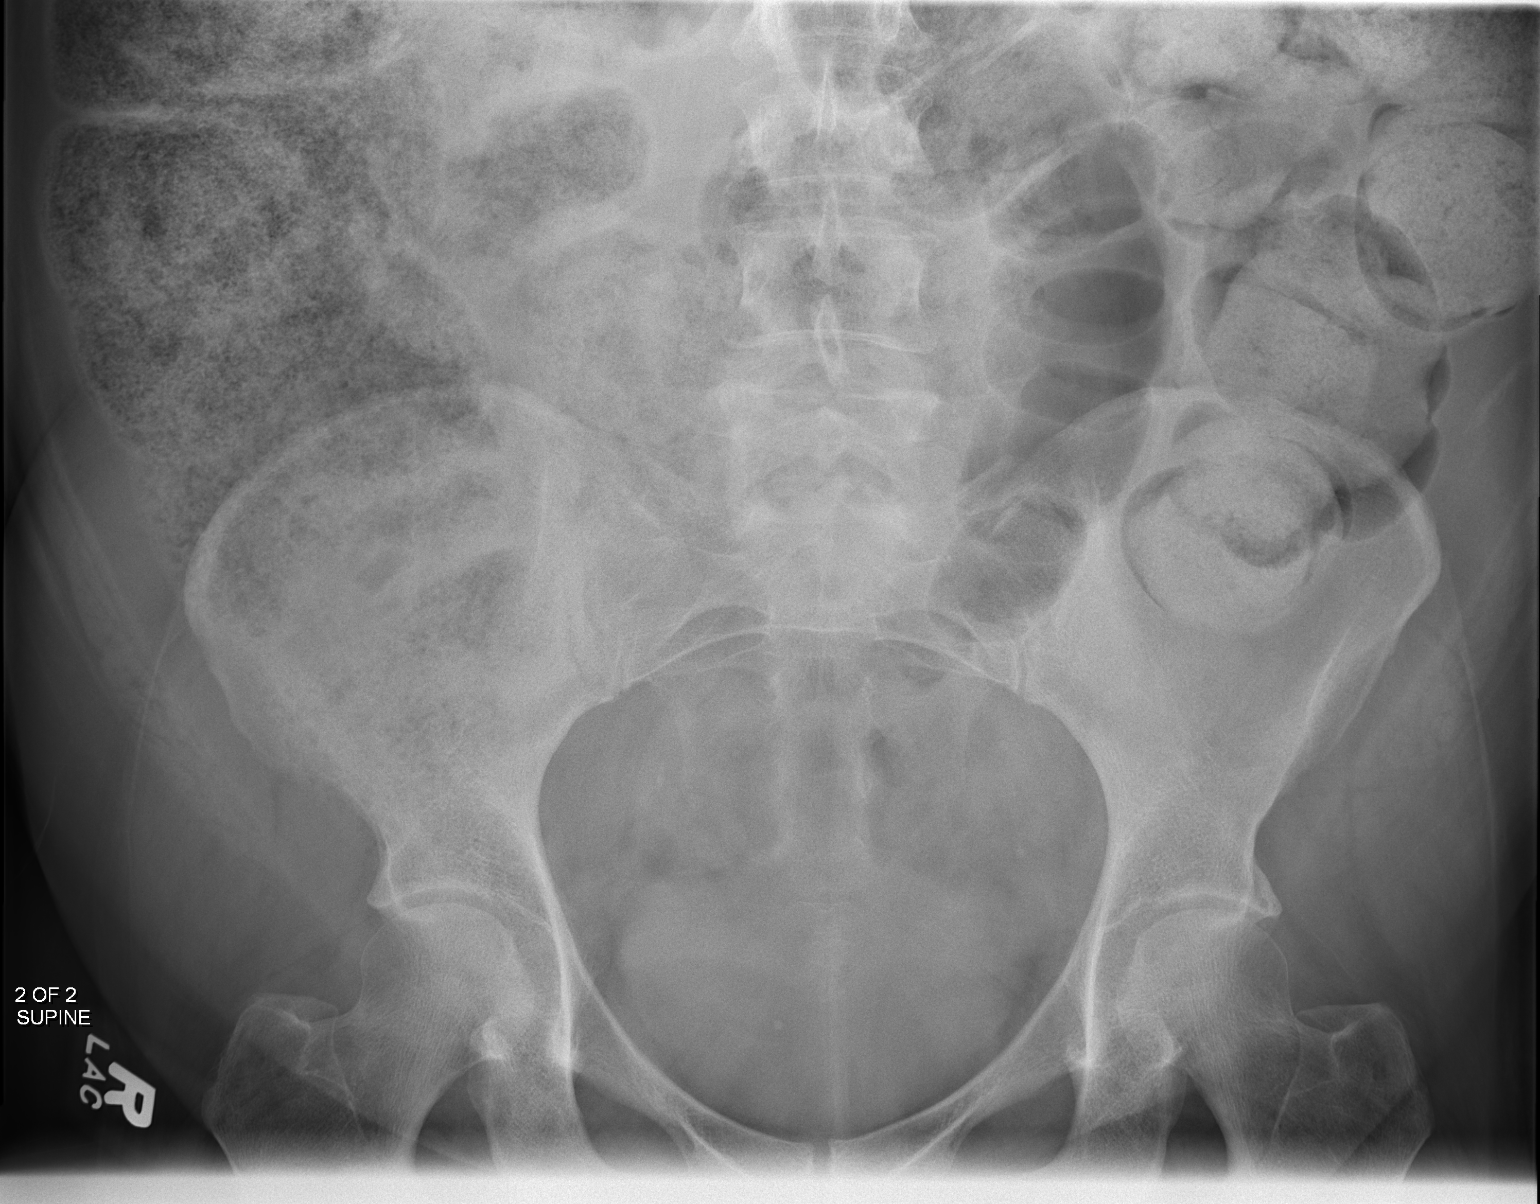

[2 of 2 positions shown; findings below may reference images not displayed]

PROCEDURE:     DXR - DXR KIDNEY URETER BLADDER  - June 16, 2012  [DATE]

RESULT:     There is a moderately large amount of fecal material in the
right colon and proximal transverse colon with some residual contrast in the
left colon along with fecal material. Correlate for distal bowel obstruction
versus constipation. There is a small amount of air in the rectum. No
definite small bowel or gastric distention is appreciated on the limited
views submitted.
IMPRESSION: Large amount of fecal material in the colon without
definite abnormal bowel distention. Severe constipation versus possible
partial obstruction. Correlate clinically.

[REDACTED]

## 2014-11-13 NOTE — Discharge Summary (Signed)
PATIENT NAME:  Anna Pena, Anna Pena MR#:  [id number] DATE OF BIRTH:  [birth year]  DATE OF ADMISSION:  05/18/2012 DATE OF DISCHARGE:  05/20/2012  ADMITTING PHYSICIAN: Dr. Lenore Manner  DISCHARGING PHYSICIAN: Dr. Gladstone Lighter  PRIMARY CARE PHYSICIAN: Dr. Loura Pardon   CONSULTATIONS IN THE HOSPITAL: Pulmonary consultation by Dr. Humphrey Rolls.   DISCHARGE DIAGNOSES:  1. Systemic inflammatory response syndrome.  2. Bilateral interstitial pneumonitis.  3. Hypertension.  4. Coronary artery disease.  5. Diabetes mellitus with hyperglycemia in the hospital secondary to steroids.   6. Hyperlipidemia.  7. Leukocytosis.  8. Depression and anxiety.   DISCHARGE HOME MEDICATIONS:  1. Crestor 40 mg p.o. daily.  2. Niaspan 1500 mg daily.  3. Spironolactone 50 mg daily.  4. Lantus 35 units sub-Q daily.  5. NovoLog insulin 8 units sub-Q t.i.d.  6. Metoprolol 25 mg p.o. b.i.d.  7. Lyrica 75 mg p.o. t.i.d.  8. Clomipramine 100 mg p.o. daily.  9. Lexapro 15 mg once a day.  10. Temazepam 30 mg p.o. daily.  11. Mirtazapine 45 mg at bedtime.  12. Xanax 0.25 mg once a day at bedtime.  13. Nitrostat sublingual tablets 0.4 mg every five minutes as needed for chest pain.  14. Aspirin 81 mg p.o. daily.  15. Prednisone 40 mg p.o. daily for four weeks and then follow up with pulmonary for further taper.   16. Robitussin AC 5 mL q.6 hours p.r.n. for cough.  17. Spiriva 18 mcg HandiHaler daily.  18. Advair 250/50, 1 puff b.i.d.  19. Doxycycline 100 mg p.o. b.i.d. for five days.   DISCHARGE DIET: Low sodium, ADA diet.   DISCHARGE ACTIVITY: As tolerated.   FOLLOWUP INSTRUCTIONS:  1. Primary care physician follow up in 1 to 2 weeks.  2. Follow up with Dr. Devona Konig in three weeks.  3. Advised to check sugars frequently as they might be elevated due to steroids and call M.D. if sugars are greater than 300.   LABORATORY, DIAGNOSTIC AND RADIOLOGICAL DATA: Chest x-ray prior to discharge showing interstitial markings on  both lungs likely reflecting pneumonia or pneumonitis and confluent alveolar infiltrate in left upper lobe which became more conspicuous now. Mycoplasma antibodies are negative. IgM and also IgG legionella antibodies are negative. Blood cultures are negative. CT chest on admission without contrast showing patchy interstitial ground glass opacities both lobes and superior segment of the lower lobes. There is a 7 cm right middle lobe pulmonary nodule.   WBC 17.7, repeat WBC 20, hemoglobin 7.7, hematocrit 36.7, platelet count 377.   Sodium 140, potassium 4.0, chloride 103, bicarbonate 26, BUN 11, creatinine 1.1, glucose 238, calcium 9.2.   ALT 19, AST 22 alkaline phosphatase 104, total bilirubin 0.2, albumin 3.6.   BRIEF HOSPITAL COURSE: Anna Pena is a 49 year old Caucasian female with past medical history significant for diabetes, hypertension who is also a chronic smoker came to the Emergency Room on 04/27/2012 for upper respiratory symptoms and she had a lower lobe infiltrate which was pneumonia versus atelectasis. She was sent home with a steroid pack and Zithromax. Since discharge from ED, she had fevers and chills, not feeling well at all and dry cough without sputum production, worsening of respiratory symptoms, came to the ER.  1. Systemic inflammatory response syndrome secondary to bilateral interstitial pneumonitis. CT revealed bilateral patchy infiltrates and ground glass opacities. She was started on azithromycin and also Zosyn and started on steroids. Seen by pulmonologist, Dr. Humphrey Rolls. He did order legionella mycoplasma antibodies which were negative. Her symptoms  have significantly improved on steroids. She is being discharged on 40 mg of prednisone for one month and then follow up with pulmonary for further taper. Because of her allergies she is being discharged on doxycycline antibiotic for now. She still has a dry cough and feels better with Robitussin. She was strongly counseled against  smoking.  2. Leukocytosis still persists probably from steroids. She has not had any fever while in the hospital. Her sugars were elevated secondary to being on steroids so she was asked to observe her sugars well and check them frequently and call M.D. if needed. She is on Lantus 35 units and also premeal short acting insulin.  3. She is also being discharged on Advair, Spiriva for improving her breathing. All her other home medications are being continued without any change. Course has been otherwise uneventful in the hospital.   DISCHARGE CONDITION: Stable.   DISCHARGE DISPOSITION: Home.   TIME SPENT ON DISCHARGE: 45 minutes.  ____________________________ Gladstone Lighter, MD rk:cms D: 05/20/2012 13:47:13 ET T: 05/21/2012 14:57:07 ET JOB#: [id number]  cc: Gladstone Lighter, MD, <Dictator> Marne A. Glori Bickers, MD Allyne Gee, MD Gladstone Lighter MD ELECTRONICALLY SIGNED 05/25/2012 14:15

## 2014-11-13 NOTE — Op Note (Signed)
PATIENT NAME:  Anna Pena, Anna Pena MR#:  [id number] DATE OF BIRTH:  Jul 01, 1966  DATE OF PROCEDURE:  06/17/2012  PREOPERATIVE DIAGNOSIS: Interstitial lung disease.   POSTOPERATIVE DIAGNOSIS: Interstitial lung disease.  OPERATION PERFORMED:  1. Preoperative bronchoscopy with bronchoalveolar lavage.  2. Right thoracoscopy with wedge resection, right upper lobe.   SURGEON: Louis Matte, MD  ASSISTANT: Marlyce Huge, MD  INDICATIONS FOR PROCEDURE: Anna Pena is a 49 year old female with a history of severe shortness of breath and CT findings consistent with interstitial lung disease. The patient was apprised of the indications and risks of the procedure and she gave her informed consent.   DESCRIPTION OF PROCEDURE: The patient was brought to the operating suite and placed in the supine position. General endotracheal anesthesia was given with a double-lumen tube. The patient underwent bronchoscopy. There was no evidence of endobronchial tumor. There was no evidence of any purulence within the airways. A BAL of the right lower lobe was performed. 10 mL of saline was inserted through the bronchoscope and then suctioned. The return was clear. This was sent for appropriate cultures. The patient was then turned for right thoracoscopy. All pressure points were carefully padded. The patient was prepped and draped in the usual sterile fashion. Three thoracic ports were created in a triangular fashion on the lower aspect of the right chest wall. Through these three incisions we were able to performed the procedure. The first incision was made anteriorly, the second posteriorly, and the third inferiorly. Because of the patient's large size, the diaphragm was pushed considerably higher than usual. This is why we elected to make our first incision anteriorly. However, once all incisions were made, the camera port was placed through the most inferior incision and was used throughout the case. We could see that  in the right middle lobe there was a 1 cm lymphoid aggregate. Palpation of this revealed a lymph node and nothing further was done with this. The remaining portions of the lung did show some evidence of carbonaceous deposits in the subpleural space. An appropriate sample the right upper lobe was taken on the basis of the CT scan findings. This area was selected on the basis of the infiltrate that was present. A portion was excised. This was accomplished with an endoscopic stapler. The staple line was removed and divided into pieces for appropriate cultures. Cultures for bacterial, fungal, mycobacterial, and tuberculosis were sent. The remaining portion of the lung was then divided and a small portion was sent to pathology for histologic frozen section. This returned an interstitial lung process. It appeared that appropriate representative material was obtained. A single chest tube was inserted through the most inferior port. It was brought out through a separate stab wound. The wounds were then closed in multiple layers using running absorbable sutures. The chest tube was secured with silk. The patient was then rolled in the supine position where she was extubated and taken to the recovery room in stable condition. ____________________________ Lew Dawes Genevive Bi, MD teo:slb D: 06/17/2012 19:16:44 ET T: 06/18/2012 10:42:06 ET JOB#: [id number]  cc: Christia Reading E. Genevive Bi, MD, <Dictator> Allyne Gee, MD Louis Matte MD ELECTRONICALLY SIGNED 06/26/2012 6:18

## 2014-11-13 NOTE — Consult Note (Signed)
PATIENT NAME:  Anna Pena, GOETZKE MR#:  [id number] DATE OF BIRTH:  [birth year]  DATE OF CONSULTATION:  07/10/2012  CONSULTING PHYSICIAN:  Huey Romans, MD  REASON FOR CONSULTATION: Right nasolabial abscess with MRSA.   HISTORY OF PRESENT ILLNESS: The patient is a 49 year old white female who has complained of a three-day onset of acute pain and swelling of her right nostril. It started with a small whitehead inside the right nostril and then progressed where it was swelling on the outside, extremely tender to touch. She presented to the Emergency Room and had this drained in the Emergency Room. She was given some clindamycin, however developed a rash, so she was admitted and put on IV vancomycin. She has continued to have swelling and redness of the right cheek and face, and consultation is placed for further evaluation. CT scan was done to look for signs of reaccumulation of the abscess.   PAST MEDICAL HISTORY: Significant for recent lung biopsy revealing idiopathic pulmonary fibrosis, systemic hypertension, diabetes mellitus type 2, hyperlipidemia, coronary artery disease, tobacco abuse but now an ex-chronic smoker, anxiety and depression. She recently had a right labial abscess that drained when she squeezed it and it no longer is tender.   REVIEW OF SYSTEMS: She is complaining of some genitourinary discomfort from the labial abscess. She had a skin sore recently. She has not complained of any throat problems. She is swallowing okay. There is no hoarseness. She denies any hearing changes. She does not have any chest pain or shortness of breath. No cough or sputum or hemoptysis.   SOCIAL HISTORY: She has been a previous smoker. Her husband was diagnosed with MRSA in October of this year. Her daughter is in the intensive care unit currently with pneumonia that is not staph aureus.   CURRENT MEDICATIONS: As noted in the chart. She had been on some prednisone because of her lungs and a recent lung  biopsy which may have suppressed her immune system enough for the infection to flare up.   DRUG ALLERGIES: IMITREX CAUSES CHEST PAIN, CIPROFLOXACIN CAUSES HIVES, ERYTHROMYCIN CAUSES ITCHING, SULFA AND SHELLFISH CAUSE ITCHING AND IVP DYE.   PHYSICAL EXAMINATION:  NOSE: Reddened along the right side. Inside the nose you can see some swelling, but there is no pointing of tissue. There are no scabs here and no sign of drainage from the previous drainage site.  The septum is straight, not swollen, and the left airway is open and pretty clear. There is some redness and slight swelling of the lower lid that is secondary to edema. She has been rubbing her eye, and she is now pink on her sclerae on the right side, but there is no crusting here or signs of drainage from her eye right now.   NECK: Negative for nodes or masses.   RADIOLOGY: CT scan shows swelling of the right facial area but no significant abscess that has developed here.   IMPRESSION: The patient has right nostril abscess that was drained in the ER and still has cellulitis involving some of the right cheek and face. There is no evidence of recurrent abscess at this time but just still cellulitis.   PLAN: I have spoken with the patient about keep her hands off of her nose, not touching anything, not pushing on it or playing with it at all. She is on IV vancomycin and has gentamicin ointment to use directly over the area. There is nothing that needs to be redrained or lanced at this time.  She is to continue to keep her nose clean inside using peroxide, if necessary, and the ointment then twice a day. She will continue with the ointment for at least two weeks, even if the swelling is down. I feel she is doing well as far as infection goes and that in the next 48 hours we should see some significant improvement in swelling on the right side of her face and nose. If it persists in swelling or worsens, then let me know and we can consider reopening the  drain site again.   ____________________________ Huey Romans, MD phj:cb D: 07/10/2012 20:53:30 ET T: 07/11/2012 12:33:47 ET JOB#: [id number]  cc: Huey Romans, MD, <Dictator> Huey Romans MD ELECTRONICALLY SIGNED 07/13/2012 20:17

## 2014-11-13 NOTE — H&P (Signed)
PATIENT NAME:  Anna Pena, Anna Pena MR#:  [id number] DATE OF BIRTH:  [birth year]  DATE OF ADMISSION:  05/18/2012  PRIMARY CARE PHYSICIAN: Dr. Loura Pardon    REFERRING PHYSICIAN: Dr. Jimmye Norman   CHIEF COMPLAINT: Progressive increase in shortness of breath and development of cough, fever, and chills.   HISTORY OF PRESENT ILLNESS: Anna Pena is a 49 year old Caucasian female who is a chronic smoker with history of diabetes and hypertension. The patient was treated on October 2nd as an outpatient for questionable pneumonia versus atelectasis. She received a steroid pack along with Zithromax. However, she said since the discharge and throughout the rest of the month she feels no energy. Her breathing was getting worse gradually. Lately she had developed fever and chills as well. Over the last two days she described dry cough without any sputum production. No hemoptysis. She denies any chest pain. Finally she decided to come to the Emergency Department and evaluation here with chest x-ray and also CAT scan of the chest revealed bilateral patchy interstitial and ground-glass opacities. There is also a 7 x 6 mm small circumscribed peripheral lateral right middle lobe noncalcified subpleural nodule or granuloma. The patient was admitted for further evaluation of her unusual pneumonia and also further treatment.  REVIEW OF SYSTEMS: CONSTITUTIONAL: The patient reports fever and chills. Also has fatigue. EYES: No blurring of vision. No double vision. ENT: No hearing impairment. No sore throat. No dysphagia. CARDIOVASCULAR: No chest pain but reports shortness of breath throughout this month getting worse gradually. No peripheral edema. No syncope. RESPIRATORY: Progressive increase in shortness of breath and dry cough. No hemoptysis. GASTROINTESTINAL: No abdominal pain. No vomiting. No diarrhea. GENITOURINARY: No dysuria. No frequency of urination. MUSCULOSKELETAL: No joint pain or swelling. No muscular pain or swelling.  INTEGUMENTARY: No skin rash. No ulcers. NEUROLOGY: No focal weakness. No seizure activity. No headache lately. PSYCHIATRY: She has a great deal of anxiety in the past and depression. She is seeing a psychiatrist for that. Symptoms are relatively controlled. ENDOCRINE: No polyuria or polydipsia. No heat or cold intolerance.    PAST MEDICAL HISTORY:  1. Diabetes mellitus, type II.  2. Systemic hypertension. 3. Coronary artery disease.  4. Tobacco abuse. 5. History of anxiety and depression, followed by a psychiatrist.   PAST SURGICAL HISTORY:  1. Hysterectomy.  2. Cholecystectomy.   FAMILY HISTORY: Both parents suffer from diabetes and hypertension. Her mother had premature coronary artery disease. She had coronary artery bypass graft in her 40's.   SOCIAL HABITS: Chronic smoker. Although she quit in 2010, she is back smoking again since April, one pack a day. She states that over the last few days she cut down significantly. No history of alcohol abuse or drug abuse.   SOCIAL HISTORY: She is married, living with her husband. She is unemployed. There is no recent history of travel anywhere outside or inside the [place].   ADMISSION MEDICATIONS:  1. Xanax 0.5 mg once a day p.r.n.  2. Temazepam 30 mg at bedtime.  3. Spironolactone 50 mg once a day.  4. NovoLog insulin 8 units before each meal and Lantus taking 35 minutes every morning. 5. Lyrica 75 mg 3 times a day. 6. Nitrostat 0.4 mg sublingual p.r.n.  7. Niaspan extended-release 750 mg taking 2 tablets once a day. 8. Crestor 40 mg once a day. 9. Mirtazapine 45 mg once at night. 10. Escitalopram 20 mg taking 2.5 tablets, that is 50 mg, once a day in addition to clomipramine 50 mg  taking 2 capsules, that is 100 mg, once a day at bedtime.  11. Aspirin 81 mg a day  12. Metoprolol 25 mg twice a day.   ALLERGIES: Ciprofloxacin causing skin rash and hives. Imitrex causing chest tightness. Erythromycin causing itching, however, she is  not allergic to Zithromax which was well tolerated. IVP dye and shellfish. She is also allergic to sulfa.   PHYSICAL EXAMINATION:   VITAL SIGNS: Blood pressure 139/76, respiratory rate 22, pulse 105, temperature 98.9, pulse oximetry 96%.   GENERAL APPEARANCE: Young female sitting in bed in no acute distress other than mild tachypnea.   HEAD: No pallor. No icterus. No cyanosis.   EARS, NOSE, AND THROAT: Hearing was normal. Nasal mucosa, lips, tongue were normal.   EYES: Normal eyelids and conjunctivae. Pupils about 4 to 5 mm, equal and reactive to light.   NECK: Supple. Trachea at midline. No thyromegaly. No cervical lymphadenopathy. No masses.   HEART: Normal S1, S2. No S3, S4. No gallop. No carotid bruits.   RESPIRATORY: Slight tachypnea without use of accessory muscles. There are fine crepitations scattered mostly appeared on the right side of the chest. Minimal crepitations on the left.   ABDOMEN: Soft without tenderness. No hepatosplenomegaly. No masses. No hernias.   SKIN: No ulcers. No subcutaneous nodules.   MUSCULOSKELETAL: No joint swelling. No clubbing.   NEUROLOGIC: Cranial nerves II through XII are intact. No focal motor deficit.   PSYCHIATRY: The patient is alert and oriented x3. Mood and affect were normal.   LABORATORY, DIAGNOSTIC, AND RADIOLOGICAL DATA: Chest x-ray and CAT scan of the chest revealed bilateral patchy interstitial and ground-glass opacities sparing the costophrenic sulci. Differential diagnosis includes atypical pneumonia versus viral pneumonia versus opportunistic infection versus hypersensitivity pneumonitis. There is a 7 x 6 mm small peripheral lateral right middle lobe noncalcified subpleural nodule or granuloma. This may need further follow-up with another CT scan within three months.   CBC showed white count of 17,000, hemoglobin 11, hematocrit 36, platelet count 377. Total CPK 93. Troponin less than 0.02. B-type Natriuretic Peptide was normal at  58. Glucose 238, BUN 11, creatinine 1.1, sodium 140, potassium 4. Normal liver function tests.   ASSESSMENT:  1. Bilateral patchy interstitial pneumonia.  2. Leukocytosis secondary to #1.  3. Tobacco abuse.  4. Systemic hypertension.  5. Diabetes mellitus, type II.  6. Coronary artery disease.  7. Anxiety. 8. Depression.   PLAN:  1. Will admit the patient. 2. Blood cultures x2 were taken.  3. It will be ideal if we can use quinolone to cover for this pattern of chest x-ray finding, however, since she is allergic to ciprofloxacin I am hesitant to use Levaquin. Therefore, I will continue with the Emergency Department that started the Zosyn and vancomycin as broad-spectrum antibiotic, however, I will add Zithromax to cover for atypical pneumonia. I will consult Pulmonary for further input about this unusual presentation.  4. Oxygen supplementation. The patient needs to quit smoking indefinitely. Will apply a nicotine patch.  5. Accu-Cheks and sliding scale to treat her uncontrolled diabetes.  6. I will continue the rest of her home medications, although I am concerned about the magnitude of her psychiatric medications including duplications of SSRI but the patient tells me that she is prescribed these medications by her psychiatrist.  7. I will treat her with DuoNebs q.4 hours p.r.n.   TIME SPENT EVALUATING THIS PATIENT: More than 55 minutes.   ____________________________ Clovis Pu. Lenore Manner, MD amd:drc D: 05/18/2012 00:36:05 ET T:  05/18/2012 06:21:27 ET JOB#: [id number]  cc: Clovis Pu. Lenore Manner, MD, <Dictator> Marne A. Glori Bickers, MD Clovis Pu Zamarah Ullmer MD ELECTRONICALLY SIGNED 05/19/2012 5:52

## 2014-11-13 NOTE — Discharge Summary (Signed)
PATIENT NAME:  Anna Pena, Anna Pena MR#:  [id number] DATE OF BIRTH:  09-Nov-1965  DATE OF ADMISSION:  06/14/2012 DATE OF DISCHARGE:  06/21/2012  ADMITTING PHYSICIAN: Fritzi Mandes, MD   DISCHARGING PHYSICIAN: Gladstone Lighter, MD    PRIMARY MD: Dr. Eulogio Bear at [facility] PULMONOLOGIST: Dr. Devona Konig   CONSULTATIONS IN THE HOSPITAL:  1. Pulmonary consultation by Dr. Devona Konig  2. Cardiothoracic consultation by Dr. Nestor Lewandowsky   DISCHARGE DIAGNOSES:  1. Acute respiratory failure secondary to bilateral patchy interstitial pneumonitis versus pulmonary fibrosis.  2. Hypertension.  3. Diabetes mellitus.  4. Depression and anxiety.  5. Constipation.  6. Hyperlipidemia.  7. The patient is a chronic smoker but recently quit.  8. Coronary artery disease.  9. Leukocytosis secondary to being on steroids.   DISCHARGE MEDICATIONS:  1. NovoLog 8 units sub-Q t.i.d. before meals.  2. Sublingual nitroglycerin 0.4 mg as needed for chest pain.  3. Prednisone 40 mg p.o. daily until seen by Pulmonary and taper as an outpatient.  4. Aldactone 50 mg p.o. daily.  5. Aspirin 81 mg p.o. daily.  6. Lyrica 75 mg p.o. t.i.d.  7. Clomipramine 100 mg at bedtime.  8. Mirtazapine 45 mg p.o. daily.  9. Lexapro 50 mg p.o. daily.  10. Spiriva 18 mcg inhalation capsule daily.  11. Advair 250/50 one puff b.i.d.  12. Metoprolol 25 mg p.o. b.i.d.  13. Temazepam 30 mg at bedtime.  14. Lantus 35 units sub-Q at bedtime.  15. Niaspan 1500 mg daily at bedtime.  16. Crestor 40 mg p.o. daily.  17. Xanax 0.5 mg b.i.d. as needed for anxiety.  18. Norco 5/325 one tablet every four hours p.r.n. for pain.  19. Tussionex 5 mL every 12 hours as needed for cough.  20. Phenergan 25 mg q.8 hours p.r.n. for nausea and vomiting.  HOME OXYGEN: 2 liters especially on exertion.   DISCHARGE DIET: Low sodium, ADA diet.   DISCHARGE ACTIVITY: As tolerated.    FOLLOW-UP INSTRUCTIONS: Follow-up with Dr.  Devona Konig in one week. Appointment has been set up with Dr. Humphrey Rolls for December 10th at 3:30 p.m.   LABS AND IMAGING STUDIES: WBC at the time of discharge was 19.6. Hemoglobin 12.8, hematocrit 39.6, platelet count 417, sodium 134, potassium 4.7, chloride 101, bicarb 27, BUN 20, creatinine 1.08, glucose 324, calcium 8.7.   The last chest x-ray prior to admission showing no evidence of interval development of pleural effusion and the tiny pneumothorax that was seen on previous x-ray is nearly imperceptible now.   The patient did have a right wedge biopsy and chest tube in place that was taken out. Cultures have been negative from the biopsy. Acid fast stain is negative. So far fungal cultures are negative. Bronchial wash cytologies negative for any malignant cells. Histology is still pending at this time.   CT of the chest with contrast showing persistent bilateral diffuse pulmonary infiltrates, could be chronic infectious pneumonitis or inflammatory etiologies cannot be excluded.   ANCA panel is negative. Anti-GBM antibodies negative. TB-Quanteferon test is indeterminate. Fungal antibodies are negative.   BRIEF HOSPITAL COURSE: Anna Pena is a 49 year old Caucasian female with past medical history significant for hypertension, diabetes mellitus, coronary artery disease, and recent admission in October 2013 for diffuse interstitial pneumonitis who was discharged on higher dose prednisone without taper and also Levaquin to be followed by Dr. Humphrey Rolls as an outpatient who came back with worsening shortness of breath for two days. She has  also been recently started on 2 liters of home oxygen and was on fourth week of steroids at home. She was still found to be hypoxic in the 70's during this admission.  1. Acute respiratory failure secondary to persistent bilateral interstitial pneumonitis on the repeat CT scan. Because of nonresolving persistent pneumonitis on the CT and also clinically cardiothoracic surgeon  consultation by Dr. Genevive Bi was requested and the patient did have a right lung wedge biopsy done and the chest tube placed. She was again started on IV steroids and treated with antibiotics including vancomycin and also Levaquin. Since the chest x-ray did not show any improvement and cultures have been negative, her antibiotics are being stopped. After discussion with Dr. Humphrey Rolls, her symptoms clinically improved and she is saturating well on room air and is requiring 2 liters oxygen on exertion. She is being discharged home on oral prednisone again at 40 mg until seen by him and will have an outpatient taper. The final biopsy result from her lung biopsy is not available at this point and she will follow-up with Dr. Humphrey Rolls about it. She does have significant anxiety and depression issues which sometimes also add on to her dyspnea and cause difficulty breathing. The patient and her husband were counseled and she does understand and agree to take all her medications as prescribed. She has been ambulating well without significant dyspnea so is being discharged home.  2. Coronary artery disease, stable. No acute cardiac symptoms. Her medications were continued.  3. Diabetes mellitus with elevated sugars while in the hospital secondary to being on IV steroids. She is on Lantus and also NovoLog. Steroids will be tapered as an outpatient.  4. Hypertension. Home medications are being continued.  5. Depression and anxiety. Xanax has been prescribed. She is already on Lexapro and Remeron as an outpatient.  6. Persistent leukocytosis secondary to being on steroids.  7. Her right side chest tube was taken out by Dr. Genevive Bi on 06/20/2012 and chest x-ray revealed a small pneumothorax and repeat chest x-ray later in the day showed improvement in pneumothorax and she was stable from that standpoint. She still has some tenderness at the chest tube insertion site. Dressing changes were explained. There was no bleeding or drainage and is  being discharged in a stable condition.   DISCHARGE DISPOSITION: Home.   TIME SPENT ON DISCHARGE: 45 minutes.   ____________________________ Gladstone Lighter, MD rk:drc D: 06/22/2012 18:41:35 ET T: 06/23/2012 14:48:44 ET JOB#: [id number]  cc: Gladstone Lighter, MD, <Dictator> [facility] Allyne Gee, MD Gladstone Lighter MD ELECTRONICALLY SIGNED 06/23/2012 18:01

## 2014-11-13 NOTE — Consult Note (Signed)
PATIENT NAME:  Anna Pena, Anna Pena MR#:  [id number] DATE OF BIRTH:  12/18/1965  DATE OF CONSULTATION:  06/15/2012  REFERRING PHYSICIAN:   CONSULTING PHYSICIAN:  Allyne Gee, MD  REASON FOR CONSULTATION: Acute respiratory failure, persistent interstitial infiltrates.  HISTORY OF PRESENT ILLNESS: This is a 49 year old female who called me on the morning of the admission. She said she was having increasing shortness of breath. She had been using her oxygen and when took her oxygen off she dropped her saturations almost immediately to about 86%. She said she had taken her dog out for a walk and she was having worsening of her shortness of breath. She had been initially admitted in October for an interstitial pneumonitis, work-up with CT scan confirmed this. She did not have a bronchoscopy or biopsy done at that time. She also was started on steroids and was given antibiotics. She really has not gotten much better, based on her worsening, and at this time, when she came into the emergency room, her saturations were about 97%, her white count was 23,000, and she had not really been having any fevers or chills.   PAST MEDICAL HISTORY:  1. Hypertension. 2. Coronary artery disease. 3. Diabetes. 4. History of tobacco use, quit about four weeks ago. 5. History of hyperlipidemia. 6. Depression and anxiety.   MEDICATIONS: Xanax, aspirin, Clomipramine, Crestor, prednisone, Niaspan, Remeron, metoprolol, Lyrica, Lantus, fluticasone, Citalopram, spironolactone, Niaspan, and Spiriva.  SOCIAL HISTORY: She quit smoking about four weeks ago. Before that she apparently was a heavy user.  FAMILY HISTORY: Positive for diabetes and hypertension.   ALLERGIES: Cipro, Imitrex, and erythromycin (not azithromycin)  REVIEW OF SYSTEMS: A complete twelve-point review of system was done and positive for fatigue and malaise. EYES: Negative for any diplopia or pain in the eyes. ENT: Negative for tenderness. CARDIOVASCULAR:  Negative for any chest pain. No palpitations noted. GASTROINTESTINAL: Negative for any vomiting, nausea, or diarrhea. GU: Negative for bleeding. HEME: Negative for bruising. SKIN: No acute rashes. MUSCULOSKELETAL: She has been having some arthritic type symptoms. NEUROLOGIC: Negative for any focal motor deficits. PSYCHIATRIC: Positive for anxiety, for which she was started on Xanax for. SKIN: No acute rashes. The remainder of complete review was unremarkable.   PHYSICAL EXAMINATION:   GENERAL: At the time that she was seen, the patient was awake and appeared to be uncomfortable, somewhat short of breath with activity.   VITAL SIGNS: Temperature 97.6, pulse 78, respiratory rate 18, blood pressure 131/68, and saturations were 96%.   NECK: The neck appeared to be supple. There was no JVD, no adenopathy, and no thyromegaly.   CHEST: Coarse breath sounds. I could not really appreciate any rales or rhonchi. Expansion appeared to be equal.   CARDIOVASCULAR: S1 and S2 is normal. Regular rhythm. No gallop or rub.   ABDOMEN: Soft and nontender.   EXTREMITIES: No cyanosis or clubbing. Pulses equal.   NEUROLOGIC: She was awake and alert, moving all four extremities. Gait was not checked.   SKIN: No acute rashes.   MUSCULOSKELETAL: No active synovitis.   RESULTS: The work she had done so far shows white count 20.6, hemoglobin 12.1, and hematocrit 36.2. BUN was 19, creatinine 1.2, glucose 298, sodium 135, and potassium 5.1.   Chest x-ray which was done shows interstitial opacities.   ASSESSMENT AND PLAN:  1. I am going to get a repeat CT scan of the chest with high resolution contrast to check for persistence of alveolar interstitial infiltrates.  2. She may need  ultimately a biopsy. We will decide after the CT scan is done.  3. We will give QuantiFERON gold.  4. ANA, ANCA, and sedimentation rate as well as anti-GBM are ordered for interstitial lung disease work-up. 5. Continue with oxygen therapy  at this time. Would continue with steroids. Agree with holding the antibiotics. I do not think this is an infection issue at this time. We will make further recommendations as deemed necessary.   Thank you for consulting me in the care of this patient. ____________________________ Allyne Gee, MD sak:slb D: 06/15/2012 08:28:25 ET T: 06/15/2012 11:15:33 ET JOB#: [id number]  cc: Allyne Gee, MD, <Dictator> Allyne Gee MD ELECTRONICALLY SIGNED 07/21/2012 9:31

## 2014-11-13 NOTE — Consult Note (Signed)
PATIENT NAME:  Anna Pena, Anna Pena MR#:  [id number] DATE OF BIRTH:  [birth year]  DATE OF CONSULTATION:  06/15/2012  REFERRING PHYSICIAN:  Dr. Fritzi Mandes  CONSULTING PHYSICIAN:  Lew Dawes. Genevive Bi, MD  REASON FOR CONSULTATION: Evaluate for surgical lung biopsy.   I have personally seen and examined Anna Pena. I have independently reviewed Anna chest x-rays and CT scans.   HISTORY OF PRESENT ILLNESS: Anna Pena is a 49 year old white female who has had approximately a two month history of increasing shortness of breath. She initially states that she was vacationing at the beach in September when she began noticing some increasing shortness of breath. In early October she presented to the Emergency Room because of increasing shortness of breath. She was treated as an outpatient with broad-spectrum antibiotics but failed to improve. She has been followed by Dr. Devona Konig who was subsequently prescribed a steroid taper. This did not help. She has had several courses of antibiotics and is currently readmitted to the hospital with significant shortness of breath with any exertion. She states that she has not had any cough. She has a nonproductive cough when she does try to bring up any sputum. She has had marked shortness of breath but no fevers. There are multiple family members who have been sick at home. She states that she has been under considerable stress recently and she feels that this may also be contributing to Anna underlying shortness of breath. She did have a CT scan made back in October and she is scheduled to have another one made later today. Because of Anna allergies she is currently being premedicated.   PAST MEDICAL HISTORY:  1. Bilateral interstitial pneumonitis.  2. History of hypertension. 3. History of hyperlipidemia, hypertriglyceridemia.  4. History of insulin-dependent diabetes mellitus. 5. Has undergone a cholecystectomy. 6. Total abdominal hysterectomy. 7. Tonsillectomy.  8. History  of bipolar disorder and anxiety and depression for what she seeks outpatient therapy.  ALLERGIES: Anna allergies include Cipro, erythromycin, intravenous pyelogram dye, shellfish and sulfa.   SOCIAL HISTORY: She is married. She has one Pena. She worked in a IT sales professional. She has not worked for the last several years. She smoked for about 30 years and then quit about three years ago but has restarted again. She has no history of alcohol or drug use.   FAMILY HISTORY: Anna mother is alive at the age of 73 with coronary artery disease. Anna father is alive at the age of 9 with diabetes mellitus. She does have a mother-in-law who has undergone a lung transplant.   REVIEW OF SYSTEMS: As per history of present illness and all other review of systems were asked and were negative.   PHYSICAL EXAMINATION:  GENERAL: Pleasant, well-developed, well-nourished woman in no distress. She was able to speak in complete sentences. She was awake, alert, and oriented.   HEENT: Head normocephalic, atraumatic. The pupils were equal. The oropharynx was clear.   NECK: Supple without thyromegaly or adenopathy. There were no carotid bruits.   LUNGS: Anna lungs were actually clear bilaterally. I did not appreciate any rales or rhonchi.   HEART: Anna heart was regular. There were no murmurs.   ABDOMEN: Anna abdomen was soft, nontender, nondistended. There were no palpable masses. There was no hepatosplenomegaly.   EXTREMITIES: Anna extremities were without clubbing, cyanosis, or edema. She had good pulses throughout.   NEUROLOGIC: Grossly within normal limits.   MUSCULOSKELETAL: Grossly within normal limits.   ASSESSMENT AND PLAN: I have independently reviewed  the patient's chest x-rays and CT scan. She had a CT scan made back in 2006 which did include some of the lung bases that was clear back then. This revealed a calcified right adrenal mass from 2006. The most recent chest CT showed patchy bilateral  interstitial infiltrates consistent with interstitial lung disease. She is scheduled to undergo another CT later today which I will review.   I explained to Anna the indications and risks of thoracoscopic lung biopsy. I explained to Anna that there were no other good options for obtaining sufficient material for diagnostic purposes. I explained to Anna the risks included bleeding, infection, air leak, mechanical ventilation and death. We also discussed the possibility that the lung biopsy may fail to reveal a specific diagnosis or even any pathology whatsoever if a more normal area of the lung was biopsied. I told Anna that there is a possibility of performing a thoracotomy if a thoracoscopic procedure could not be performed. She understood all these risks. We will obtain the chest CT today. I will review that tomorrow and we will try to get Anna on the Operating Room schedule for later this week if at all possible.   Thank you very much for allowing me to participate in Anna care.   ____________________________ Lew Dawes. Genevive Bi, MD teo:cms D: 06/15/2012 10:48:15 ET T: 06/15/2012 12:34:09 ET JOB#: [id number]  cc: Christia Reading E. Genevive Bi, MD, <Dictator>  Louis Matte MD ELECTRONICALLY SIGNED 06/26/2012 6:18

## 2014-11-13 NOTE — Consult Note (Signed)
PATIENT NAME:  Anna Pena, Anna Pena MR#:  [id number] DATE OF BIRTH:  Sep 28, 1965  DATE OF CONSULTATION:  07/14/2012  REFERRING PHYSICIAN:  Vivien Presto, MD CONSULTING PHYSICIAN:  Heinz Knuckles. Winry Egnew, MD  REASON FOR CONSULTATION: MRSA abscesses.   HISTORY OF PRESENT ILLNESS: The patient is a 49 year old white female with a past history significant for interstitial pulmonary fibrosis, diabetes and obsessive compulsive disorder who was admitted on 12/14 with a several-day history of pain, swelling, redness and drainage from the right nostril. She has also had a painful swollen area of the left labia. The area over the nostril has caused swelling to extend infraorbitally bilaterally. She has had redness and pain and swelling over the cheek as well. She states she has had fevers, chills and sweats. She has had some upper respiratory congestion with cough, sputum production and shortness of breath, but she cannot tell if these are new symptoms or related to her underlying IPF. The area on the labia has been draining. She was admitted to the hospital and started on vancomycin. She was given Unasyn for several days. Culture of the nasal area has grown MRSA. She continues on vancomycin currently. Her OCD makes it difficult for her not to scratch her lesions and she has developed some erythema of the eyes. She was seen by ophthalmology and has been put on bacitracin drops. She is also receiving gentamicin ointment to the nose. Overall she is clinically improved and she has not had any new lesions, other than the redness of the eyes develop since admission. She is currently feeling better with decreased inflammation around the face.   ALLERGIES: CIPRO, CLEOCIN, ERYTHROMYCIN, IMITREX, IVP DYE, SULFA AND SHELLFISH.   PAST MEDICAL HISTORY: 1. Multiple episodes of pneumonia over the last several months of 7 specific episodes. She was subsequently diagnosed with interstitial pulmonary fibrosis. She is currently on high  dose steroids for this. She is on oxygen mainly at night.  2. Hypertension. 3. Diabetes.  4. Hypercholesterolemia.  5. Coronary artery disease.  6. Anxiety and depression.  7. Obsessive-compulsive disorder.  8. Status post hysterectomy.  9. Status post cholecystectomy.   FAMILY HISTORY: Positive for hypertension, diabetes, coronary artery disease and her daughter was recently hospitalized with severe pulmonary failure and is currently admitted at [facility]. The patient states that she may have H1N1 influenza.   SOCIAL HISTORY: The patient lives with her husband. She smokes a pack of cigarettes per day. She has quit in the past but restarted recently. She does not drink. No injecting drug use history.   REVIEW OF SYSTEMS:   GENERAL: Positive for fevers, chills, sweats, generalized fatigue and malaise.   HEENT: No headaches. Positive sinus congestion. Positive nasal congestion. Positive sore throat.   NECK: No stiffness. No swollen glands.   RESPIRATORY: Cough with sputum production and shortness of breath.   CARDIAC: No chest pains. No palpitations. No peripheral edema.   GASTROINTESTINAL: No nausea, no vomiting and no abdominal pain. She has chronic constipation.   GENITOURINARY: No complaints.   MUSCULOSKELETAL: Generalized aching but no focal hot joints.   SKIN: She has had erythema on the face from the right nostril, as described above. She has also had swelling, pain and drainage from a left labial lesion.   NEUROLOGIC: No focal weakness.   PSYCHIATRIC: She has obsessive-compulsive disorder and has been scratching at her wounds. All other systems are negative.    PHYSICAL EXAMINATION:  VITAL SIGNS: T-max 98.7, T-current 97.3, pulse 84, blood pressure 133/78 and  95% on room air.   GENERAL: A 49 year old obese white female in no acute distress.   HEENT: Normocephalic, atraumatic. Pupils are equally round and reactive to light. Extraocular motion intact. Sclerae, conjunctivae  and lids without evidence for emboli or petechiae. There is no periorbital edema. The nose is within normal limits. There is no erythema or drainage from the nose. There is no swelling. Her nasolabial fold was equal bilaterally. Oropharynx shows no erythema or exudate. Gums are in fair condition.   NECK: Supple. Full range of motion. Midline trachea. No lymphadenopathy. No thyromegaly.   CHEST: Clear to auscultation bilaterally with exception of occasional crackles. No tachypnea. No focal consolidation.   CARDIAC: Regular rate and rhythm without murmur, rub or gallop.   ABDOMEN: Soft, nontender and nondistended. No hepatosplenomegaly. No hernia is noted.   EXTREMITIES: No evidence for tenosynovitis.   SKIN: The left labia was slightly erythematous and edematous. There was no obvious purulent drainage from the area. There was no obvious lymphadenopathy seen. There was no stigmata of endocarditis, specifically no Janeway lesions or Osler nodes.   NEUROLOGIC: The patient is awake and interactive, moving all four extremities.   PSYCHIATRIC: Mood and affect appeared normal.   LABORATORY DATA: BUN of 18, creatinine 1.12, bicarbonate 27 and anion gap 6. White count of 17.4 with a hemoglobin 10.7, platelet count 423 and ANC of 14.7. White count on admission was 20.4.   Blood cultures from admission show no growth. A wound culture from the nose is growing MRSA.   Urinalysis was unremarkable.   RADIOLOGIC DATA: Chest x-ray showed cardiomegaly with mild interstitial edema.   CT scan of the maxillofacial area showed soft tissue abnormality. No obvious abscess. No orbital abscess was present.   IMPRESSION: A 49 year old female with a history of obsessive-compulsive disorder, diabetes and interstitial pulmonary fibrosis on chronic steroids admitted with multiple MRSA abscesses.   RECOMMENDATIONS: 1. Her lesions are improving. They all appear to be draining well. Her OCD makes it difficult for her  not to scratch the lesions and potentially spread to other sites.  2. We will change her to doxycycline. We will plan on treating for 10 days total.  3. She is likely chronically colonized. I will plan on seeing her in the office in three weeks to discuss MRSA eradication.  4. If possible would decrease her immunosuppression.  5. There is no benefit to adding a topical agent to the nose. We will stop the gentamicin ointment.  6. Bacitracin may not be active against all strains of staph. There has been some link to use of  topical triple antibiotic (containing bacitracin) to the spread of MRSA. We will change the eyedrops to gentamicin, which sensitivities indicate it is definitely sensitive to. 7. Would maintain contact isolation while in house.  This is a moderately complex infectious disease case. Thank you very much for involving me in Ms. Heiler's care. ____________________________ Heinz Knuckles. Omesha Bowerman, MD meb:sb D: 07/14/2012 15:28:43 ET T: 07/14/2012 15:43:14 ET JOB#: [id number]  cc: Heinz Knuckles. Shandie Bertz, MD, <Dictator> Robley Matassa E Phenix Vandermeulen MD ELECTRONICALLY SIGNED 07/18/2012 12:18

## 2014-11-13 NOTE — Consult Note (Signed)
Brief Consult Note: Diagnosis: Labial abscess.   Recommend further assessment or treatment.   Comments: I have reviewed the patient chart.  In light of the fact that her labial abscess is spontaneously draining I would continue with the current antibiotic coverage.  The only possible pathogen that would be missed is Gonorrhea of [place] which sometimes can be causative organisms in labial abscesses.  Could consider adding doxycyline to current regimen, will obtain GC/CT from urine sample.  I will see the patient later this afternoon, if there is any remaining fluid collection I would proceed with a bedside I&D with packing at that time.  Electronic Signatures: Dorthula Nettles (MD)  (Signed 15-Dec-13 12:48)  Authored: Brief Consult Note   Last Updated: 15-Dec-13 12:48 by Dorthula Nettles (MD)

## 2014-11-13 NOTE — Consult Note (Signed)
Impression: 49yo female w/ h/o OCP, DM and IPF on chronic steriods admitted with multiple Methacillin Resistant Staph aureus abscesses.  Her lesions are improving.  They all appear to be draining well.  Her OCD makes it difficult for her not to scratch at the lesions and potentially spread them to new sites. Will change her to doxycycline.  Would treat for 10 days total. She is likely chronically colonized.  I will plan on seeing her in the office in three weeks to discuss Methacillin Resistant Staph aureus eradication. If possible, would decrease immune suppression. There is no benefit to adding a topical agent to the nose.  Will stop the gentamicin ointment. 6) Bacitracin may not be active against all strains of Staph.  There has been some link to use of topical triple antibiotic (containing Bacitracin) to spread of Methacillin Resistant Staph aureus.  Will change the eye drops to gentamicin. 7) Maintain contact isolation while in house.   Electronic Signatures: Tattianna Schnarr, Heinz Knuckles (MD) (Signed on 19-Dec-13 15:08)  Authored   Last Updated: 19-Dec-13 15:11 by Reda Gettis, Heinz Knuckles (MD)

## 2014-11-13 NOTE — Op Note (Signed)
PATIENT NAME:  Anna Pena, Anna Pena MR#:  [id number] DATE OF BIRTH:  [birth year]  DATE OF PROCEDURE:  07/11/2012  PREOPERATIVE DIAGNOSES:   1. Facial abscess and cellulitis. 2. Preseptal cellulitis. 3. Left labial abscess. 4. Need for extended intravenous antibiotics and intravenous therapy.  POSTOPERATIVE DIAGNOSES: 1. Facial abscess and cellulitis. 2. Preseptal cellulitis. 3. Left labial abscess. 4. Need for extended intravenous antibiotics and intravenous therapy.  PROCEDURES:  1. Ultrasound guidance for vascular access to right basilic vein.  2. Fluoroscopic guidance for placement of catheter.  3. Insertion of peripherally inserted central venous catheter, right arm.  SURGEON:  Leotis Pain, MD  ANESTHESIA: Local.   ESTIMATED BLOOD LOSS: Minimal.   INDICATION FOR PROCEDURE: The patient is a 49 year old female with the above-mentioned problems.  She needs a peripherally-inserted central venous catheter for IV antibiotics and other therapy due to her multiple ongoing issues.  DESCRIPTION OF PROCEDURE: The patient's right arm was sterilely prepped and draped, and a sterile surgical field was created. The right basilic vein was accessed under direct ultrasound guidance without difficulty with a micropuncture needle and permanent image was recorded. 0.018 wire was then placed into the superior vena cava. Peel-away sheath was placed over the wire. A single lumen peripherally inserted central venous catheter was then placed over the wire and the wire and peel-away sheath were removed. The catheter tip was placed into the superior vena cava and was secured at the skin at 40 cm with a sterile dressing. The catheter withdrew blood well and flushed easily with heparinized saline. The patient tolerated procedure well. ____________________________ Algernon Huxley, MD jsd:cb D: 07/11/2012 16:59:18 ET T: 07/12/2012 15:36:43 ET JOB#: [id number]  cc: Algernon Huxley, MD, <Dictator> Algernon Huxley  MD ELECTRONICALLY SIGNED 07/15/2012 18:37

## 2014-11-13 NOTE — Consult Note (Signed)
PATIENT NAME:  Anna Pena, Anna Pena MR#:  [id number] DATE OF BIRTH:  [birth year]  DATE OF CONSULTATION:  07/14/2012  REFERRING PHYSICIAN:   CONSULTING PHYSICIAN:  Heinz Knuckles. Osborne Serio, MD  NO DICTATION.     ____________________________ Heinz Knuckles Anna Zartman, MD meb:es D: 07/14/2012 15:18:16 ET T: 07/14/2012 15:27:37 ET JOB#: [id number]  cc: Heinz Knuckles. Jamarii Banks, MD, <Dictator> Vivaan Helseth E Perfecto Purdy MD ELECTRONICALLY SIGNED 07/18/2012 12:18

## 2014-11-13 NOTE — Discharge Summary (Signed)
PATIENT NAME:  Anna Pena, NASER MR#:  [id number] DATE OF BIRTH:  [birth year]  DATE OF ADMISSION:  07/09/2012 DATE OF DISCHARGE:  07/16/2012  CONSULTANTS:  Dr. Clayborn Bigness from infectious disease. Dr. Kathyrn Sheriff from ENT. Dr. Star Age from OB/GYN.   CHIEF COMPLAINT:  Facial swelling as well as nose swelling, redness and abscess formation, labial abscess complaint.   DISCHARGE DIAGNOSES: 1.  Multiple methicillin-resistant Staphylococcus aureus abscesses including right nostril, labial and preseptal with some cellulitis with autoinoculation secondary to itching.  2.  Idiopathic pulmonary fibrosis, on high-dose steroids for multiple months.  3.  Chronic obstructive pulmonary disease exacerbation, oxygen dependent but noncompliant with oxygen.  4.  Uncontrolled diabetes.  5.  Hyperlipidemia.  6.  Acute renal failure.  7.  Coronary artery disease.  8.  Bipolar.  9.  Hypertension.  10.  Anxiety   DISCHARGE MEDICATIONS:  Nitrostat 0.4 mg sublingual 1 tab every 5 minutes as needed for chest pain, spironolactone 50 mg daily, clomipramine 50 mg 2 caps once a day at bedtime, mirtazapine 45 mg daily, citalopram 50 mg once a day, Niaspan extended-release 750 mg extended-release 2 tabs once a day at bedtime, Crestor 40 mg daily, metoprolol tartrate 25 mg 2 times a day, temazepam 30 mg once a day at bedtime, promethazine 25 mg every 8 hours as needed for nausea, vomiting; Advair 250/50 mcg 1 puff 2 times a day, Spiriva 18 mcg 1 cap daily, aspirin 81 mg daily prednisone 60 mg daily, Lyrica 75 mg daily, insulin glargine 48 units subcu injection at bedtime, insulin aspart 15 units 3 times a day with meals, alprazolam 0.5 mg every 8 hours as needed for anxiety, doxycycline 100 mg every 12 hours for 10 days, Percocet 1 tab 325/5 mg every 6 hours p.r.n. for pain. The patient will going home with 2 liters nasal cannula oxygen continuous.   DIET: Low sodium, ADA diet.   ACTIVITY: As tolerated.   FOLLOWUP: Please follow with  your primary care physician within 1 to 2 weeks. Please follow with Dr. Fabio Asa office within early to mid next week for followup and CBC check. If  worsening redness, cellulitis, swelling or onset of fevers, please call your primary care physician right away.   DISPOSITION: Home.   HISTORY OF PRESENT ILLNESS: For full details of the H and P, please see the dictation on 07/09/2012 by Dr. Lenore Manner but briefly this is a 49 year old female, ex-smoker, COPD with interstitial fibrosis of the lungs, oxygen dependent, who is on high-dose steroids by her pulmonologist, who presented with the above chief complaint. The patient had facial swelling and left labial abscess as well, and had some leukocytosis without any fevers and was admitted to the hospitalist service. The nasal area was I and D'd by Dr. Thomasene Lot in the ER and pus was sent in for culture which did come back with MRSA. She was started on vancomycin. OB/GYN was also consulted. The patient was admitted to the hospitalist service.   SIGNIFICANT LABS AND IMAGING: Initial creatinine 1.41, glucose 462. WBC on arrival 20.4, lowest WBC was 15.8, last WBC of 21.3, initial hemoglobin 11.4, platelets 397. Blood cultures no growth to date on arrival and wound culture from the nose grew MRSA. Last creatinine was 1.12 on 07/14/2012. X-ray of the chest on arrival showing cardiomegaly with mild interstitial edema. CT of maxillofacial area without contrast showing significant abnormality involving the soft tissues of the nose and adjacent soft tissues in maxilla region and nasolabial fold area. No abscess evident. No  orbital abscess evident, some calcification in posterior inferior medial right orbital region.   HOSPITAL COURSE: The patient was admitted to the hospitalist service and started on vancomycin.  The patient did have nasal abscess initially drained in the ER.  The cultures were sent. She was started on some pain medicines as well. The patient has been on  high-dose steroids and also apparently has been itching herself. The patient had multiple abscesses with evidence of cellulitis including facial abscess, intranasal abscess and a labia abscess which self-drained. The patient was seen by ENT as well as OB/GYN. She did not have any fevers but her WBC did go up and down. Infectious disease was consulted as well. She has been having inoculation for the bacteria from vulva down to other places by scratching herself. The patient had several counseling sessions by various physicians about hand hygiene and cleaning, and has also been seen by infectious disease doctor, Dr. Clayborn Bigness. Her cellulitis is resolving. She did also have some preseptal cellulitis which also is improving. This was secondary to inoculation of bacteria into her eyes. She was on gentamicin eyedrops and the case was discussed previously with Dr. Tobe Sos. That has resolved since. The patient is immunocompromised in the setting of high-dose prednisone. She is to follow with Dr. Humphrey Rolls as an outpatient and steroids should be tapered off quickly. This was communicated to the patient and the patient's significant other, and they are aware that they need to follow with Dr. Humphrey Rolls sooner than later as she has been on high-dose steroids for quite a while. She has been noncompliant with her oxygen here. She states she is short of breath and dizzy when she ambulates and gets labored but she does not wear her oxygen when she ambulates, however. She does not have a productive cough. An x-ray of the chest on arrival did not suggest pneumonia. Her diabetes was uncontrolled through this hospitalization, largely given the high-dose steroids she is on. Her Lantus and aspirin have been up-titrated. She did have mild acute renal failure on arrival, likely secondary to dehydration, and got better with IV fluids. She has anxiety issues and has a sick daughter at [facility] at the moment, for which patient has lots of concern  about and her Xanax dose has been changed from 0.5 every 12 hours or so to 3 times daily p.r.n. At this point, her WBC has been trending up but the patient has no fever and her cellulitis and abscesses in various regions have dramatically improved. I discussed the case with Dr. Clayborn Bigness, who has been following the patient. At this point, given the symptomatic improvement and resolution of the cellulitis and abscesses, we will discharge the patient and she can follow up with Dr. Clayborn Bigness early next week for another CBC check. She  will be discharged with 10 days of doxycycline. She is also encouraged to follow with her primary care physician within 1 to 2 weeks.   TOTAL TIME SPENT: 40 minutes.   CODE STATUS:  The patient is full code.    ____________________________ Vivien Presto, MD sa:cs D: 07/16/2012 14:50:00 ET T: 07/17/2012 20:02:51 ET JOB#: [id number]  cc: Heinz Knuckles. Blocker, MD Vivien Presto, MD, <Dictator> Primary care physician   Vivien Presto MD ELECTRONICALLY SIGNED 07/26/2012 14:12

## 2014-11-13 NOTE — Consult Note (Signed)
Consulting Service: MedicinePhysician: Wilfred Curtis MD Question: Left labial abscess of Present Illness: 49 year old female presenting with right periorbital cellulitis, intranasal abscess, and left labial abscess.  The patient first noted symptoms two days ago.  The patient denies a history of prior abscess formation or hydradenitis.  The labial abscess began draining spontaeneously yesterday, is less painfull today.  She denies associated fevers or chills.  The patient has underlying diabetes, and  is currently on high dose steroids for idiopathic pulmonary fibrosis on recent biopsy. Her baseline oxygen requirement is 3L [place].  Her husband has also recently been treated for MRSA.  She is in a monogamous relationship, and denies risk factors for either gonorrhea or chlamydia. of Systems: 10 point review of sytems negative unless otherwise noted in HPI Medical History:DM IIIdiopathic pulmonary fibrosisHTNHyperlipedemiaCADAnxietyDepression Surgical History:HysterectomyCholecystectomyLung biopsy history: denies history of STI or abnormal paps History: non-contributory history: former smoker, denies EtOH or illicit drug use Medications:Alprazolam 0.5mg  po bid prn anxietyUnasyn 3g IV every 8hrsASA 81mg  po dailyBacitracin opthalmic ointment apply to right eye every 4hrsclomipramine 100mg  po bedtimeEscitalopram 50mg  po dailyFluticasone/Salmeterol 250/50 1 puff inhaled dailyGentamicin 0.1% ointment apply to nostril every 6hrsHeparin 5000U SQ every 12hrsHydromorphone 1 to 2mg  IV push every 4hrs prn painInsulin Asparte 10 SQ AC tidGlargine 35 units SQ each morningSliding scale novologMetoprolol 25mg  po bidMirtazapine 45mg  po bedtimeNiacin 1500mg  po bedtimeNitroglycerine 0.4mg  sublingual q67min prn chest pain x 3 dosesOpthalmic lubricant 2 drops right eye every 2hrs as needed for pruritusPrednisone 60mg  tab po dailyPromethazine 67m tab po every 8hrs prn nauseaPregabalin 75mg  po tidRosuvastatin 40mg  po  dailySpirinolactone 50mg  bo dailyTemazepam 15mg  po bedtimeTetracaine 0.5% opthalmic drops 2 drops left eyer prn painTiotropium 1 capsue inhalation dailyVancomycin 1500mg  IV daily Cipro, Cleocin, Erythromycin, Imitrex, IV contrast, Sulfa, Shellfish Exam:VSSAppears in no acute distress, older than states agenormocephalic, anicteric, periorpital cellulitis of the right eye, cellulitis involivng the right nares, injected sclera right eyeon [place], no increased accessory muscle use, does not need to pause while speakingtrace bilateral tibial edemaobese, soft, non-tender, non-distendedCellulitis involving the left labia majora, the labia is soft without evidence of induration or underlying fluctulance, that area that has previously drained is visible and has sealed up, unable to express any further discharge.no other lesions of concernA&O x 3 on admission 07/09/2012:WBC 20,000; H&H 11.4 & 38.4, platelets 397,000Sodium 136, Chloride 101, Potassium 4.0, HCO3 20, BUN 14, Cr 1.41, BG 462, AST 27, ALT 18, T. Bili 0.2, Ca 9.2Culture: heavy grwoth of staph aureus 49 yo female with left labial abscess, right perioribial cellulitis, and right nares abscess Labial abscess - likely secondary to poor glucose controll in setting of DM and high dose steroids.  Her white count of 20,000 on admission may be secondary to her underlying infection but is likely compounded by her current steroid use as well.  As the labial abscess has spontaneously drained and there is no additional fluid collection that would be amenable to drainage recommend continuing current antibiotic coverage which should be more than adequate.  The patient does not have any risk factors for gonorrhea or chlamydia infection but a urine GC/CT DNA amplificaiton specimen was sent.  Should this return positive would add doxycyline to regimen.  Should she develop a drainable fluid collection would proceed with bedside I&D given comorbidities and baseline oxygen requirement  would be less risky for the patient as oppossed to operative procedure.  Will continue to follow along     Electronic Signatures: Dorthula Nettles (MD)  (Signed on 15-Dec-13 17:38)  Authored  Last Updated: 15-Dec-13 17:38 by Dorthula Nettles (MD)

## 2014-11-13 NOTE — H&P (Signed)
PATIENT NAME:  Anna Pena, Anna Pena MR#:  [id number] DATE OF BIRTH:  [birth year]  DATE OF ADMISSION:  06/14/2012  PRIMARY CARE PHYSICIAN: Eulogio Bear at [facility]  PULMONOLOGIST: Dr. Devona Konig   CHIEF COMPLAINT: Increasing shortness of breath for two days.   HISTORY OF PRESENT ILLNESS: Anna Pena is a pleasant 49 year old Caucasian female who is well known to our service, last seen by me and discharged on 10/25. She was admitted from 10/23 to 10/25 with bilateral upper lobe interstitial pneumonitis, treated with steroids and p.o. antibiotics. Patient was followed up by Dr. Humphrey Rolls, continued to have shortness of breath., started on home oxygen as outpatient and is on her fourth week oral steroids. Patient comes to the Emergency Room after she noticed increasing shortness of breath when she went to get her dog outside for a walk. She went without oxygen, came home, was gasping for air, found to be very hypoxic at home in the 70s, came over to the Emergency Room and received IV Solu-Medrol, breathing treatments, placed her back on oxygen. Her sats during my evaluation were 97% on 2 liters. She received a dose of IV vancomycin and Zosyn. Her white count is 23,000. Denies any fever or productive phlegm. She is being admitted for SIRS secondary to bilateral interstitial pneumonitis/infiltrate with elevated white count.   PAST MEDICAL HISTORY:  1. Bilateral interstitial pneumonitis, recently admitted from 10/23 to 10/25.   2. Hypertension.  3. Coronary artery disease.  4. Type 2 diabetes.  5. Hyperlipidemia.  6. Depression/anxiety.  7. Chronic tobacco abuse, patient quit smoking four weeks ago.   HOME MEDICATIONS:  1. Prednisone 20 mg, 2 tablets once a day. She is on a taper, this is her three out of fourth week.   2. Alprazolam 0.25 mg p.o. daily.  3. Aspirin 81 mg daily.  4. Clomipramine 50 mg 2 capsules orally daily.  5. Crestor 40 mg daily.  6. Escitalopram 20 mg 2-1/2 tablets daily.   7. Fluticasone salmeterol 250/50, 1 puff b.i.d.  8. Lantus 35 units in the morning.  9. Lyrica 75 mg daily.  10. Metoprolol 25 mg 1 tablet 2 times a day.  11. Remeron 45 mg at bedtime.  12. Niaspan ER 750 mg extended release 2 tablets daily.  13. Nitroglycerin as needed.  14. NovoLog regular insulin 8 units t.i.d.  15. Spironolactone 50 mg daily.  16. Temazepam 15 mg 2 capsules daily at night.  17. Spiriva 18 mcg inhalation daily.   FAMILY HISTORY: Positive for diabetes and hypertension. Mother had premature coronary artery disease.   SOCIAL HISTORY: Married, lives with her husband. She is unemployed. There no recent history of travel. She has a dog as a Loss adjuster, [employer]. She was a chronic smoker. She quit four weeks ago. No history of alcohol or drug use.   ALLERGIES: Cipro causes skin rash hives. Imitrex causing chest tightness. Erythromycin causing itching, however, she is not allergic to Zithromax. Intravenous pyelogram dye and shellfish. She is also allergic to sulfa.    REVIEW OF SYSTEMS: CONSTITUTIONAL: No fever. Positive for fatigue, weakness. EYES: No blurred or double vision, glaucoma. ENT: No tinnitus, ear pain, hearing loss. RESPIRATORY: Positive for shortness of breath, cough. CARDIOVASCULAR: No chest pain, orthopnea, edema. GASTROINTESTINAL: No nausea, vomiting, diarrhea or abdominal pain. GENITOURINARY: No dysuria, hematuria. ENDOCRINE: No polyuria or nocturia. HEMATOLOGY: No anemia or easy bruising. SKIN: No acne, rash. MUSCULOSKELETAL: Positive for arthritis. NEUROLOGIC: No cerebrovascular accident, transient ischemic attack. PSYCH: Positive for mild anxiety and  depression. No bipolar disorder. SKIN: Warm and dry. No rash or any ecchymosis. All other systems reviewed and negative.   PHYSICAL EXAMINATION:  GENERAL: Patient is awake, alert, oriented x3.   VITAL SIGNS: Afebrile, pulse 97, blood pressure 112/73, sats 100% on 2 liters, respirations 22 per minute.   HEENT: Atraumatic,  normocephalic. Pupils are equal, round, and reactive to light and accommodation. Extraocular movements intact. Oral mucosa is moist.   NECK: Supple. No JVD. No carotid bruit.   RESPIRATORY: Bilateral equal air entry. There is fine rales present in the bilateral upper lobes. No wheezing, acute respiratory distress, or use of accessory muscles.   CARDIOVASCULAR: Both the heart sounds are normal. Rate, rhythm regular. PMI not lateralized. Chest nontender.   EXTREMITIES: Good pedal pulses, good femoral pulses. No lower extremity edema.   ABDOMEN: Soft, benign, nontender. No organomegaly. Positive bowel sounds.   NEUROLOGIC: Grossly intact cranial nerves II through XII. No motor or sensory deficits.   PSYCH: Patient is awake, alert, oriented x3. Mild anxiety.   LABORATORY, DIAGNOSTIC AND RADIOLOGICAL DATA: Chest x-ray shows bilateral nonspecific interstitial opacities, appear increased from prior studies. White count 23,000, hemoglobin and hematocrit 11.7 and 37.1, platelet count 343, glucose 166, BUN 13, creatinine 1.14, sodium 134, potassium 3.6, chloride 101, bicarbonate 24, calcium 9. Troponin 0.02. B-type natriuretic peptide 92.   ASSESSMENT AND PLAN: 49 year old Anna Pena with history of coronary artery disease, type 2 diabetes and hypertension presents with increasing shortness of breath. She is being admitted with:  1. Bilateral patchy interstitial pneumonitis/infiltrate. Patient had recently finished a course of broad-spectrum antibiotic with Levaquin and currently is on a steroid taper. She is on 40 mg p.o. daily. This is her third week of the total four weeks she is supposed to take steroids as outpatient. She is followed by Dr. Devona Konig. Patient will be admitted on medical floor. Will continue IV Solu-Medrol around the clock. She is already on vancomycin and Zosyn. Patient does not appear to be infected. If she remains afebrile consider discontinuing antibiotics tomorrow. Dr. Humphrey Rolls will  see patient in consultation. Continue oxygen, nebulizer treatments around-the-clock. Patient's hypersensitivity pneumonitis panel has been negative. Her mycoplasma and serology along with Legionella antigen has been negative. These were done in October 2013. She also had a CT chest without contrast which showed bilateral patchy interstitial infiltrates in both upper lobes. There is a possibility patient will need lung biopsy to get the pathology and to get an idea what is causing her to be so hypoxic and with bilateral interstitial infiltrates. I will defer that to Dr. Devona Konig.  2. Type 2 diabetes. Continue Lantus and NovoLog. Will check Accu-Cheks and give regular insulin as needed.  3. Coronary artery disease. Continue aspirin, statins, metoprolol and p.r.n. nitroglycerin as needed.  4. Hypertension. Patient already is on spironolactone and metoprolol.  5. Anxiety, depression. Patient on clomipramine and alprazolam.  6. Hyperlipidemia. On Niaspan and Crestor.  7. Deep vein thrombosis prophylaxis. Sub-Q heparin.  8. Further work-up according to patient's clinical course. Hospital admission plan was discussed with patient. No family members were present. Case was also discussed with Dr. Devona Konig who will see patient in consultation.   TIME SPENT: 50 minutes.  ____________________________ Hart Rochester Posey Pronto, MD sap:cms D: 06/14/2012 13:36:59 ET T: 06/14/2012 14:06:25 ET JOB#: [id number]  cc: Pattye Meda A. Posey Pronto, MD, <Dictator> [facility] Allyne Gee, MD, Wheeler MD ELECTRONICALLY SIGNED 06/16/2012 20:12

## 2014-11-13 NOTE — H&P (Signed)
PATIENT NAME:  Anna Pena, Anna Pena MR#:  [id number] DATE OF BIRTH:  [birth year]  DATE OF ADMISSION:  07/09/2012  PRIMARY CARE PHYSICIAN: Dr. Loura Pardon.  REFERRING PHYSICIAN: Dr. Francene Castle.   CHIEF COMPLAINT: Nose and right facial swelling and redness and abscess formation. The patient also complaining of labial abscess.   HISTORY OF PRESENT ILLNESS: Anna Pena is a 49 year old Caucasian female, recently had lung biopsy showing interstitial fibrosis. She is an ex-chronic smoker. She has underlying diabetes mellitus. She is on high dose of steroids, receiving prednisone 60 mg daily. Also recently started on oxygen 3 L via nasal cannula. The patient indicates that over the last 2 days, she developed an abscess with a whitehead inside the right nostril. This progressed with nose swelling and right facial swelling and redness and tenderness. Along with that, she also developed in the pelvic area, left labial abscess;  yesterday ruptured as the patient squeezed it and white pus came out. She has residual swelling. She reports no fever. No chills.   REVIEW OF SYSTEMS:  CONSTITUTIONAL: Denies any fever. No chills. No fatigue.  EYES: No blurring of vision. No double vision, however, she developed some edema below the right eye.  ENT: Hearing was normal. No hearing deficit. No sore throat. No dysphagia. She has pain and swelling of the right nostril.  CARDIOVASCULAR: No chest pain. No worsening cough or shortness of breath. No syncope.  RESPIRATORY: No cough. No sputum production. No change in her respiratory status. No hemoptysis.  GASTROINTESTINAL: No abdominal pain. No vomiting. No diarrhea.  GENITOURINARY: No dysuria. No frequency of urination. Again, she has left labial abscess that was ruptured.  MUSCULOSKELETAL: No joint pain or swelling. No muscular pain or swelling other than the pain of the right side of the chest from recent lung biopsy.  INTEGUMENTARY: No skin rash other than erythema and  little discharge from one of the wounds at the right chest from recent lung biopsy. No other ulcers or rash.  NEUROLOGY: No focal weakness. No seizure activity. No headache.  PSYCHIATRY: She has a great deal of anxiety in the past and depression. She is seeing a psychiatrist.  ENDOCRINE: No polyuria or polydipsia. However, her sugar is uncontrolled with the advancement of her steroids.  HEMATOLOGY: No easy bruisability. No lymph node enlargement.   PAST MEDICAL HISTORY: Recent lung biopsy revealed idiopathic pulmonary fibrosis, systemic hypertension, diabetes mellitus, type 2, hyperlipidemia, coronary artery disease, tobacco abuse, but she is now an ex-chronic smoker, anxiety and depression.   PAST SURGICAL HISTORY: Recent lung biopsy, hysterectomy and cholecystectomy.   FAMILY HISTORY: Hypertension and diabetes, this includes both parents. Her mother also has premature coronary artery disease and had coronary artery bypass graft in her 23s.   SOCIAL HABITS: Ex-chronic smoker, she quit 2 months ago. Used to smoke a pack a day. No history of alcohol or other drug abuse.   SOCIAL HISTORY: She is married, living with her husband. She is unemployed.   ADMISSION MEDICATIONS: Temazepam 45 mg at bedtime p.r.n., Xanax 0.5 mg twice a day p.r.n., spironolactone 50 mg once a day, NovoLog insulin 8 units before each meal 3 times a day, Lantus 35 units once a day in the morning, Lyrica 75 mg 3 times a day, Niaspan extended-release 750 mg 2 tablets once a day, Crestor 40 mg once a day, Nitrostat sublingual 0.4 mg p.r.n.,  mirtazapine 45 mg once at night, escitalopram 20 mg taking 2-1/2 tablets a day, that is 50 mg once a  day, clomipramine 50 mg taking 2 capsules a day, aspirin 81 mg a day, metoprolol 25 mg twice a day, Advair Diskus 250, 1 inhalation twice a day, Spiriva 1 inhalation once a day, prednisone 60 mg once a day, promethazine 25 mg q. 8 hours p.r.n.   ALLERGIES: IMITREX CAUSING CHEST PAIN,  CIPROFLOXACIN CAUSING HIVES, ERYTHROMYCIN CAUSING ITCHING, SULFA, SHELLFISH AND IVP DYE.   PHYSICAL EXAMINATION:  VITAL SIGNS: Blood pressure 125/65, respiratory rate 20, pulse 97, temperature 98.1, pulse oximetry is 97%.  GENERAL APPEARANCE: This is a young female lying in bed in no acute distress.  HEAD AND NECK: Remarkable for facial swelling and redness on the right side and right lower eyelid is puffy and edematous. No pallor. No icterus. No cyanosis. Ear examination revealed normal hearing. No discharge. No lesions. Nasal examination revealed right nasal abscess and soft tissue swelling. The area is status post incision and drainage done in the Emergency Department. There is right facial redness and swelling. The right lower lid is edematous. Oropharyngeal area showed no lesions, no oral thrush and no ulcers. Eye examination revealed normal eyelids and conjunctiva, although the conjunctivae looks quite  congested bilaterally. Pupils are about 4 mm, equal and reactive to light. Neck is supple. Trachea at midline. No thyromegaly. No cervical lymphadenopathy. No masses.  HEART: Normal S1, S2. No S3, S4. No murmur. No gallop. No carotid bruits.  RESPIRATORY: Normal breathing pattern without use of accessory muscles. No rales. No wheezing.  ABDOMEN: Soft without tenderness. No hepatosplenomegaly. No masses. No hernias.  SKIN: No ulcers. No subcutaneous nodules. There are three spots on the right side of the chest from recent lung biopsy, two of them are healing well and one of them is draining a little serous discharge. This is a very minimal. There is minimal erythema around this area.  MUSCULOSKELETAL: No joint swelling. No clubbing.  NEUROLOGIC: Cranial nerves II through XII are intact. No focal motor deficit.  PSYCHIATRY: The patient is alert and oriented x3. Mood and affect were normal.   LABORATORY FINDINGS: Chest x-ray showed cardiomegaly and interstitial increased markings consistent with her  diagnosis of pulmonary fibrosis. CBC showed white count 20,000, hemoglobin 11.4, hematocrit 34 and platelet count 397. Serum glucose 462, BUN 14, creatinine 1.4, sodium 136, potassium 4. Her total protein 7.5, albumin 3.1, bilirubin 0.2, alkaline phosphatase 140, AST 18, ALT 27. Urinalysis was unremarkable except for more than 500 of glucose.   ASSESSMENT:  1.  Right facial and right nostril abscess formation complicated by cellulitis.  2.  Left-sided labial abscess, status post rupture.  3.  Idiopathic pulmonary fibrosis and chronic respiratory failure, now maintained on oxygen 3 L. 4.  The patient is status post recent lung biopsy.  5.  Chronic obstructive pulmonary disease without exacerbation. She is ex-chronic smoker now.  6.  Diabetes mellitus, type 2, uncontrolled.  7.  Systemic hypertension.  8.  Coronary artery disease.  9.  Anxiety and depression.  10. Leukocytosis secondary to the steroids and possibly additional factor is the cellulitis and abscess formation.   PLAN: The right nasal area was I and D by Dr. Thomasene Lot and the pus was sent for culture. The patient received initially clindamycin; however, she developed a skin rash and that was changed to vancomycin. Of note, her husband, a few weeks ago, was diagnosed to have MRSA infection. Hopefully, vancomycin will cover both the facial abscess and cellulitis and also the left labial abscess that is already ruptured. I will consult GYN  for further input about the labial abscess, if there is any further interaction or I and D or just to continue antibiotics. Blood cultures x2 were taken. We will place the patient on Accu-Cheks and sliding scale. Continue home medications as listed above. Oxygen supplementation 3 L.   TIME SPENT EVALUATING THIS PATIENT: Took more than one hour including reviewing medical records and extensive medications that she is taking.    ____________________________ Clovis Pu. Lenore Manner, MD amd:aw D: 07/09/2012 23:27:12  ET T: 07/10/2012 11:44:31 ET JOB#: [id number]  cc: Clovis Pu. Lenore Manner, MD, <Dictator> Ellin Saba MD ELECTRONICALLY SIGNED 07/10/2012 22:40

## 2014-11-13 NOTE — Consult Note (Signed)
Brief Consult Note: Diagnosis: persistant pneumonitis.   Patient was seen by consultant.   Consult note dictated.   Discussed with Attending MD.   Comments: she has persistant SOB qwiht infiltrates noted on the CXR. Her last CT was in october. I would repeat a CT chest again. She may need a tissue biopsy and in this case an OLB would be the best approach. Will get CTS consultation. Also could consider bronch for cultures. Sent prelim labs also.  Electronic Signatures: Allyne Gee (MD)  (Signed [phone] 08:04)  Authored: Brief Consult Note   Last Updated: 20-Nov-13 08:04 by Allyne Gee (MD)

## 2014-11-16 NOTE — H&P (Signed)
PATIENT NAME:  Anna Pena, Anna Pena MR#:  [id number] DATE OF BIRTH:  June 18, 1966  DATE OF ADMISSION:  09/01/2012  REFERRING PHYSICIAN: Dr. Benjaman Lobe.   PRIMARY CARE PHYSICIAN: Dr. Radford Pax.  PULMONOLOGIST: At [facility].   CHIEF COMPLAINT:   Back pain, right breast pain.   HISTORY OF PRESENT ILLNESS:  The patient is a pleasant 49 year old Caucasian female with multiple medical conditions including history of hepatic pulmonary fibrosis, who was on high-dose prednisone for several months, developed MRSA  abscesses and cellulitis, was hospitalized in December, who now follows with [facility], who is now on prednisone taper and CellCept; diabetes, hypertension, severe depression and anxiety, who presents with above chief complaint. The patient stated that she has had progressive back pain in the lumbar spine, worse now and feels like it is deep down inside without any significant radiation, but feels unsteady on her feet. The patient has peripheral neuropathy and is not aware if she has any numbness or anything, but also experienced some jerking of her left upper extremity. On Saturday, she started to have active drainage from inferior to the nipple of right breast of pussy material.  The next day, started to have redness and swelling and severe tenderness around the area and was given doxycycline by her PCPs. More recently, as of yesterday, she has had a fever of 100.3 and presented to the hospital. She has no fever here, but has leukocytosis and evidence of cellulitis in the right breast. She was given vancomycin and hospitalist services were contacted for further evaluation and management.   PAST MEDICAL HISTORY:   1.  Lung biopsy showing idiopathic pulmonary fibrosis on CellCept and prednisone taper.  2.  Systemic hypertension.  3.  Diabetes, uncontrolled.  4.  Hyperlipidemia.  5.  CAD.  6.  Ongoing tobacco abuse.  7.  Chronic respiratory failure on p.r.n. oxygen.  8.  Anxiety.  9.  Depression.  10.   Bipolar.  11.  History of recent MRSA labial abscess with self-inoculation to multiple areas, including face and nose.   PAST SURGICAL HISTORY:  Lung biopsy, hysterectomy, cholecystectomy.  FAMILY HISTORY:  Hypertension, diabetes, both parents. Mom also had CAD and CABG in her 83s.   SOCIAL HISTORY:  Still smokes about 1/2 to 3/4 pack a day, but promises to stop. No alcohol or drug use. Lives with her husband, is unemployed.   ALLERGIES: CIPRO, CLEOCIN, ERYTHROMYCIN, IMITREX, IV PUSH DYE, SULFA, SHELLFISH.   OUTPATIENT MEDICATIONS: Advair 250/50 mcg inhaled 1 puff 2 times a day, aspirin 81 mg daily, Ativan 2 mg 3 times a day, clomipramine 50 mg 2 caps once a day at bedtime, Crestor 40 mg at bedtime, citalopram once a day, glimepiride 1 mg 2 times a day, Lantus 45 units once a day, Lyrica 75 mg 3 times a day, metoprolol tartrate 25 mg 1 tab 2 times a day, mirtazapine 45 mg at bedtime, multivitamin 1 tablet daily, mycophenolate mofetil 500 mg 2 times a day, niaspan extended-release 750 mg 2 tabs once a day at bedtime, NovoLog 100 units sliding scale, Os-Cal 500 with D 1 tab once a day, prednisone 20 mg 1 tab once a day in a taper fashion every 2 weeks, Restoril 1 cap once a day at bedtime, Spiriva 18 mcg daily, spironolactone 50 mg 1 tab once a day and Ventolin HFA p.r.n.   REVIEW OF SYSTEMS:  CONSTITUTIONAL:  Positive for fever, chills and shakes in the left upper extremity and lower extremities.  EYES:  Chronic blurry vision.  EAR, NOSE, THROAT:  No tinnitus or hearing loss. Has rhinorrhea, chronic.  CARDIOVASCULAR:  Has a dry cough and shortness of breath, not more so than baseline. No syncope.  RESPIRATORY:  Cough, has interstitial lung disease.  GASTROINTESTINAL:  Has nausea, no vomiting or diarrhea.  GENITOURINARY:  Denies dysuria or hematuria.  MUSCULOSKELETAL:   Back pain, as above.  SKIN:  Positive for a rash in right breast with drainage on Saturday, and tenderness.  NEUROLOGIC:  Globally weak. PSYCHIATRIC:  Positive for anxiety, depression and bipolar.  ENDOCRINE:  Has elevated blood sugars, polyuria.  HEMATOLOGIC:  Positive for easy bruising   PHYSICAL EXAMINATION: VITAL SIGNS:  Temperature on arrival 98.9, pulse was 100, respiratory 20, blood pressure 142/73, O2 sat 98% on room air.  GENERAL:  The patient is an obese Caucasian female, sitting in bed, anxious.  HEENT:  Normocephalic, atraumatic, flushed face. Pupils are equal and reactive. Anicteric sclerae. Moist mucous membranes. Poor dentition.  NECK:  Supple. No thyroid tenderness. No cervical lymphadenopathy.  CARDIOVASCULAR:  S1 and S2, regular rate and rhythm. No significant murmurs are appreciated.  LUNGS:  Clear to auscultation without significant wheezing or rhonchi.  ABDOMEN:  Soft, nontender, nondistended. Positive bowel sounds in all quadrants.  EXTREMITIES:  No significant lower extremity edema.  SKIN:  There is a diffuse rash, right breast, without significant loculation or drainage. There is about a 1 cm ulceration below the nipple without any active drainage. It is tender and erythematous.  NEUROLOGIC:  Cranial nerves II through XII grossly intact. Strength is 5/5 in all extremities. Sensation is intact to light touch.  There is hyperreflexia of bilateral patellar tendons.  PSYCHIATRIC:  Awake, alert, oriented x3. Cooperative, pleasant.   RADIOLOGIC AND LABORATORY DATA:  Glucose 214, BUN 20, creatinine 1.33, sodium 135, potassium 4.8, chloride 105. GFR of 47. LFTs within normal limits. WBC 16.7; of note, it was 20.1 on January 24. Hemoglobin 12.6, platelets 426.  UA not suggestive of infection. CT lumbar spine: No acute abnormalities. X-ray: Hypoinflation, interstitial markings of both lungs are increased. There is no alveolar evidence of CHF.   ASSESSMENT AND PLAN:  We have a pleasant and unfortunate 49 year old Caucasian female with multiple medical conditions including interstitial pulmonary  fibrosis, who was on high-dose steroids, now on a prednisone taper and CellCept, with a history of recent methicillin-resistant Staphylococcus aureus labial abscess with self-inoculation through scratching to multiple areas; diabetes, severe depression, anxiety, and ongoing tobacco abuse, who presents with sepsis including tachycardia, leukocytosis with cellulitis of the right breast and some back pain.  1.  Sepsis. As above, the patient has tachycardia and leukocytosis on admission with evidence of cellulitis without any significant drainage or loculations of the right breast. The patient also has  back pain with history of methicillin-resistant Staphylococcus aureus.  Makes me consider a lumbar area epidural abscess. The patient also has hyperreflexia and describes weakness and jerking of the lower extremities.  I will start the patient on vanc and Zosyn, as the patient is immunocompromised. I would follow with the blood cultures and place the patient on isolation. Although CT of the lumbar spine is negative, I would obtain an MRI for better imaging. Furthermore, I would obtain an ultrasound of the right breast to look for any drainable abscess.  There is no active drainage at this point. If the patient does happen to have bacteremia or abscess,  would likely need imaging of her brain as well to rule out septic emboli, but that is  less likely at this point.  2.  Pulmonary fibrosis. She is on oxygen as needed and sees pulmonary at [facility] and is on CellCept, which I would order and obtain a level and resume prednisone taper.  3.  Diabetes. I will continue outpatient regimen, check a hemoglobin A1c.  4.  Severe depression and anxiety. The patient would require resumption of outpatient medications including high-dose Ativan. Could have also been Ativan that causes the jitteriness and the jerking motions, but she states that it has been helping her. I would obtain a psych consult for help with medications, as she is  experiencing multiple stressors in her life.    5.  Hypertension. I would continue outpatient medications.  6.  Tobacco abuse. She was counseled for 3 minutes about smoking cessation. She does want a patch, and she did promise to smoke 1 more pack and then to stop.  7.  I would start her on deep vein thrombosis prophylaxis with heparin, resume her aspirin and  statin.   CODE STATUS:  The patient is full code.   Total Time Spent: 60 minutes.      ____________________________ Vivien Presto, MD sa:dm D: 09/01/2012 21:55:36 ET T: 09/01/2012 22:40:28 ET JOB#: [id number]  cc: Vivien Presto, MD, <Dictator> Dr. Bruce Donath [facility] MD ELECTRONICALLY SIGNED 09/12/2012 15:48

## 2014-11-16 NOTE — Discharge Summary (Signed)
PATIENT NAME:  Anna Pena, Anna Pena MR#:  [id number] DATE OF BIRTH:  April 06, 1966  DATE OF ADMISSION:  09/01/2012 DATE OF DISCHARGE:  09/03/2012  PRIMARY CARE PHYSICIAN  Dr. Radford Pax.   CHIEF COMPLAINT: Back pain and right breast pain.   DISCHARGE DIAGNOSES: 1.  Right breast cellulitis, improving.  2.  Back pain.  3.  Depression, anxiety, chronic.  4.  Chronic interstitial lung disease, followed by [facility].   CONDITION ON DISCHARGE: Fair. Vitals stable.   MEDICATIONS: 1.  Spironolactone 50 mg daily.  2.  Clomipramine 50 mg 2 capsules at bedtime.  3.  Remeron 45 mg at bedtime.  4.  Escitalopram 20 mg 2-1/2 tablets daily.  5.  Niaspan ER and 750 mg extended release 2 tablets at bedtime.  6.  Metoprolol 25 mg b.i.d.  7.  Advair 250/50 1 puff b.i.d.  8.  Spiriva 18 mcg inhalation daily.  9.  Aspirin 81 mg daily.  10.  Crestor 40 mg at bedtime.  11.  Lantus 45 units daily.  12.  Prednisone 20 mg daily.  13.  NovoLog 12 units per sliding scale.  14.  Lyrica 75 mg 1 capsule 3 times a day.  15.  CellCept 500 mg 1 tablet b.i.d.  16.  Glimepiride 1 tablet b.i.d.  17.  Os-Cal plus vitamin D 1 p.o. daily.  18.  Ventolin HFA 2 puffs as needed for wheezing.  19.  Restoril 30 mg at bedtime.  20.  Hydromorphone, 2 mg q.6 as needed.  21.  Ativan 2 mg 1 tablet 4 times a day for the next 4 to 5 days, then switch back to 3 times a day. 23.  Doxycycline 1 capsule b.i.d., complete your own prescription at home for cellulitis.   FOLLOWUP:  1.  Follow up with Leta Baptist at [facility] in 1 to 2 weeks.  2.  Keep your appointment with [facility] as scheduled.  3.  Get an appointment through your PCP's office to see orthopedics for your back pain.   CONSULTATIONS:  None.   PROCEDURES: None.   LABORATORY AND DIAGNOSTIC DATA:  MRI of lumbar spine showed prominent degree of epidural fat at the level of L5-S1 which causes mass effect and severe narrowing and narrowing of the thecal  sac. This is increased from prior.  This can be seen with epidural lipomatosis.  White count is 14.7, hemoglobin and hematocrit 12.2 and 37.3, platelet count is 343. Basic metabolic panel within normal limits. Magnesium 1.9. Hemoglobin A1c 9.0. Blood cultures negative in 36 hours. Chest x-ray shows hypoinflation which accentuates the lung markings but interstitial markings of both lungs are increased.  There is no alveolar pneumonia or evidence of CHF. Urinalysis negative for UTI.   BRIEF SUMMARY OF HOSPITAL COURSE:  The patient is a 49 year old Caucasian female with past medical history of chronic interstitial lung disease was admitted with:   1.  Right breast mild cellulitis. The patient has a history of MRSA furunculosis and cellulitis. She was seen recently as outpatient for a furuncle that appeared on her right breast and has surrounding cellulitis. She was seen by primary care physician and was started on doxycycline. She came over to the Emergency Room. Her white count was 16,000.  She did not have any evidence of sepsis. She was started initially on IV vancomycin and Zosyn. Her blood cultures remained negative and hence, switched to p.o. doxycycline to finish up her course at home. Her cellulitis improved. She remained afebrile. White  count was improving.  2.  Back pain without any radicular symptoms or any neuro deficits. The patient had some back pain.  She underwent MRI of the lumbar spine which results as above were noted. The patient did not have any neuro deficit. She was able to ambulate without any difficulty. She was started on some Dilaudid and given Ativan for muscle relaxant. She was recommended to see orthopedics as an outpatient through referral by primary care physician. She is already scheduled by her primary care physician for outpatient physical therapy which she is advised to continue.  3.  Chronic interstitial pulmonary fibrosis. She is on oxygen as needed. She sees pulmonary at  [facility]. The patient is on CellCept and prednisone which had been continued.  4.  Type 2 diabetes is uncontrolled due to dietary noncompliance and the patient being on chronic steroids. The patient was advised to continue home medications including insulin.  5.  Severe depression/anxiety. The patient also was continued on her escitalopram and Remeron. She was also continued on his Ativan. She did complain of some jerky movement in her left upper extremity, which appeared to sound like myoclonic jerks. She was advised to increase her Ativan for next few days to see if it helps. If it continues to get worse. She was advised to see her primary care physician and possible neurology as outpatient.  6.  Hypertension, on metoprolol and spironolactone. 7. Tobacco abuse. She was counseled on smoking cessation.  8.  Hospital stay otherwise remained stable.   CODE STATUS: The patient remained a full code.   TIME SPENT: 40 minutes.    ____________________________ Hart Rochester Posey Pronto, MD sap:ct D: 09/04/2012 06:57:00 ET T: 09/04/2012 08:12:17 ET JOB#: [id number]  cc: Charlottie Peragine A. Posey Pronto, MD, <Dictator> Dr. Leta Baptist Ilda Basset MD ELECTRONICALLY SIGNED 09/06/2012 15:33

## 2014-11-16 NOTE — Consult Note (Signed)
PATIENT NAME:  Anna Pena, Anna Pena MR#:  [id number] DATE OF BIRTH:  03-21-1966  DATE OF CONSULTATION:  09/02/2012  CONSULTING PHYSICIAN:  Libbie Bartley K. Mikena Masoner, MD  AGE: 49 years. SEX: Female. RACE: White.  SUBJECTIVE: The patient is a 49 year old white female, not employed, last worked 2 years ago and did odd jobs and worked for [employer] and quit because of physical problems. The patient has been married for 48 years and lives with her husband who is 33 years old. The patient came for admission to [facility] with the chief complaint of: "My back pain, MRSA which I had for a few days and I don't know where I got it and how I got it."   PAST PSYCHIATRIC HISTORY: History of inpatient hold psychiatry once before at [facility] under the care of Dr. Weber Cooks. Was inpatient 2 years ago for depression for 3 days. No history of suicide attempts. Being followed on outpatient basis by Dr. Weber Cooks. Last appointment was on 08/30/2012. Next appointment coming up in 2 months.   ALCOHOL AND DRUGS: Denies. Denies street or prescription drug abuse. Does admit smoking nicotine cigarettes for many years and quit recently.  MENTAL STATUS EXAMINATION: The patient is seen relaxing in her bed in room #119. Dressed in hospital gown. Alert and oriented to place, person and time. Calm, pleasant and cooperative. No agitation. Affect is appropriate with her mood, which is neutral and stable. Denies feeling depressed, as medications are helping her. Denies feeling hopeless or helpless. Denies feeling worthless or useless. Denies any ideas or plans to hurt herself or others. No psychosis. Memory is intact. Cognition is intact. General knowledge of information is fair. Insight and judgment fair and adequate. She reports she did have OCD symptoms and thoughts that bothered her, but they are under control with the help of current medications and she has been stabilized.   IMPRESSION: Major depressive disorder, recurrent.  Obsessive compulsive disorder, stable on medications. Anxiety state, not otherwise specified, stable on medications.  RECOMMENDATIONS: Continue current medications, that is, Anafranil, Remeron, Lexapro and Ativan as recommended by Dr. Weber Cooks, as these medications have been working for her. The patient will be followed by Dr. Weber Cooks in 2 months.    ____________________________ Wallace Cullens. Franchot Mimes, MD skc:jm D: 09/02/2012 15:41:40 ET T: 09/02/2012 20:06:33 ET JOB#: [id number]  cc: Arlyn Leak K. Franchot Mimes, MD, <Dictator> Dewain Penning MD ELECTRONICALLY SIGNED 09/04/2012 17:59

## 2014-11-18 NOTE — Discharge Summary (Signed)
PATIENT NAME:  Anna Pena, Anna Pena MR#:  [id number] DATE OF BIRTH:  11-01-1965  DATE OF ADMISSION:  09/16/2011 DATE OF DISCHARGE:  09/19/2011  DISCHARGE DIAGNOSES:  1. Diabetic ketoacidosis, resolved.  2. Uncontrolled diabetes with Hemoglobin A1c of 9.6. 3. Acute gastroenteritis, resolved. 4. Hypokalemia.  5. Coronary artery disease. 6. Hypertension. 7. Hyperlipidemia and hypertriglyceridemia. 8. Depression.   HISTORY/HOSPITAL COURSE: This is a 49 year old female who has history of diabetes, insulin-dependent, she follows with Dr. Belinda Fisher, also history of coronary artery disease, hypertension, and depression. She presented with nausea, vomiting, diarrhea, and abdominal pain. She presented with diabetic ketoacidosis. When she came in, her sugar was found to be 404, bicarbonate was 15, and her anion gap was 23. Her acetone was negative. Her urinalysis showed a glucose greater than 500, although no ketonuria. So she had mild diabetic ketoacidosis. She was given aggressive IV hydration. She was taking Lantus 15 units at home and the Lantus was increased. She was also started on NovoLog sliding scale. She was also hypokalemic when she came in with a potassium of 2.6 and she was dehydrated with a creatinine of 1.44. Her anion gap resolved with IV hydration. She did not require insulin drip. Her creatinine improved to 0.94 with hydration and her bicarbonate improved to 21. She was having some nausea, vomiting, and diarrhea. Stool studies were sent. Stool culture was negative. Stool for C. difficile was negative. Urinalysis was negative for nitrite and leukocyte esterase. Hemoglobin A1c was done which showed that the Hemoglobin A1c was 9.6. So the patient currently is on Lantus 25 units at bedtime along with NovoLog before meals. I will continue that and this can be further adjusted as an outpatient. She follows with Dr. Belinda Fisher for her diabetes management. I advised her to follow up with Dr.  Belinda Fisher an outpatient. She was hypokalemic and hypomagnesemic during the hospital stay. That has been replaced. Her magnesium at discharge is 1.9 and potassium is 3.2. I gave her 40 mEq of p.o. potassium. She also takes Aldactone at home. The Aldactone will also prevent any hypokalemia. Her creatinine was normal in the range of 1. She is well-hydrated right now. Her nausea and vomiting have completely resolved. Her TSH is 0.767. Lipase is normal at 88. Her lipid profile showed triglycerides of 475 and cholesterol 238. The patient is on Niaspan and Crestor and will continue that. LFTs are normal. When she came her white count was 14.4. She was hemoconcentrated with hemoglobin of 12.3. That improved with hydration. Her white count now is 8.9 and hemoglobin is baseline in the range of 10.9. She had a chest x-ray at admission which was essentially negative. I advised a low fat diet.   DISCHARGE HOME MEDICATIONS: 1. Metoprolol 25 mg twice a day. 2. Gabapentin 600 mg 2 tablets three times daily. 3. Crestor 40 mg daily. 4. Niaspan 750 mg  2 tablets at bedtime.  5. Seroquel 10 mg daily.  6. Albuterol as needed. 7. Nitrostat as needed.  8. Spironolactone 50 mg daily.  9. Change Lantus to 25 units subcutaneous at bedtime and start NovoLog 5 units subcutaneous three times daily before meals.  CONDITION AT DISCHARGE: She is comfortable. T-max is 97.7, heart rate 87, blood pressure 117/73, and saturating 98% on room air. Chest is clear. She is well-hydrated. Heart sounds are regular. Abdomen is soft and nontender.   DIET: Low sodium, ADA diet, low-fat, low cholesterol diet.   ACTIVITY: As tolerated.   DISCHARGE FOLLOWUP: Followup with  Dr. Alba Cory in 1 week. Followup BMP in Dr. Marliss Coots office. Followup with Dr. Belinda Fisher in 1 week for followup of diabetes.  TIME SPENT: 40 minutes.  ____________________________ Mena Pauls, MD ag:slb D: 09/19/2011 10:31:16 ET     T: 09/19/2011 14:47:12 ET         JOB#: [id number] cc: Marne A. Glori Bickers, MD Lenard Simmer, MD Mena Pauls MD ELECTRONICALLY SIGNED 09/26/2011 13:30

## 2014-11-18 NOTE — H&P (Signed)
PATIENT NAME:  Anna Pena, MINELLA MR#:  [id number] DATE OF BIRTH:  [birth year]  DATE OF ADMISSION:  09/16/2011  PRIMARY CARE PHYSICIAN: Dr. Loura Pardon   REFERRING PHYSICIAN: Dr. Renard Hamper    CHIEF COMPLAINT: Nausea, vomiting, diarrhea, and abdominal pain x5 days.   HISTORY OF PRESENT ILLNESS: The patient is a 49 year old Caucasian female with a history of hypertension, diabetes, coronary artery disease, and depression who presented to the ED with the above chief complaint. The patient is alert, awake, oriented in no acute distress. She stated that she has had nausea, vomiting, and diarrhea for about five days. In addition, she has abdominal pain which is diffuse, intermittent, cramp, no radiation. She has diarrhea whenever she goes to the bathroom. In addition, she has polyuria, polydipsia. She also feels hot and sometimes cold. She also complains of cough and mild shortness of breath. Her blood sugar was more than 500 for the past few days at home. She was on metformin three weeks ago but changed to Lantus 15 units at bedtime. She denies any ill contact or travel history. Her anion gap is 23, potassium 2.6. She was treated with IV fluids, 2 liters, and potassium 60 mEq in the ED. Dr. Renard Hamper admitted the patient for DKA.   PAST MEDICAL HISTORY:  1. Hypertension.  2. Diabetes.  3. Coronary artery disease.  4. Depression.   PAST SURGICAL HISTORY: None.   FAMILY HISTORY: Hypertension, diabetes, heart disease.   ALLERGIES: Cipro, erythromycin, Imitrex, IV dye, sulfa, shellfish.   HOME MEDICATIONS:  1. Albuterol 2 puffs p.r.n.  2. Byetta 5 mcg/0.02 mL sub-Q solution 1 sub-Q twice daily.  3. Crestor 40 mg p.o. daily.  4. Gabapentin 600 mg t.i.d. 5. Lithium 300 mg p.o. 2 tablets b.i.d.  6. Metoprolol 25 mg p.o. daily.  7. Niaspan ER 750 mg tablets 2 tablets at bedtime. 8. Nitrostat p.r.n.  9. Nortriptyline 50 mg p.o. at bedtime.  10. Ranexa 1000 mg p.o. b.i.d.  11. Seroquel 300 mg p.o. daily.   12. Spironolactone 25 mg 1 tablet p.o. daily. 13. Vitamin D3 2000 international units p.o. 1 tablet once daily.   REVIEW OF SYSTEMS: CONSTITUTIONAL: The patient denies any fever or chills. No headache or dizziness but has weakness. ENT: No double vision or blurred vision. No slurred speech or dysphagia. No epistaxis or discharge from ear or nose. No postnasal drip. No sore throat. CARDIOVASCULAR: No chest pain, palpitations, orthopnea, or nocturnal dyspnea. No leg edema. PULMONARY: Positive for cough, mild shortness of breath, history of asthma. No hemoptysis or sputum. GI: Positive for abdominal pain, nausea, vomiting, and diarrhea. No melena or bloody stool. GU: No dysuria, hematuria, or incontinence but has urine frequency. ENDOCRINE: Positive for polyuria, polydipsia, and hot and cold. SKIN: No rash or jaundice. NEUROLOGY: No syncope, loss of consciousness, or seizure. MUSCULOSKELETAL: No joint pain or edema. PSYCHIATRY: No depression or anxiety.   PHYSICAL EXAMINATION:   VITALS: Temperature 98.1, blood pressure 150/76, respirations 18, pulse 96, oxygen saturation 100% on room air.   GENERAL: The patient is alert, awake, oriented in no acute distress.   HEENT: Pupils are round, equal, reactive to light and accommodation. Moist oral mucosa. Clear oropharynx.   NECK: Supple. No JVD or carotid bruit. No lymphadenopathy. No thyromegaly.   CARDIOVASCULAR: S1, S2 regular rate and rhythm. No murmurs or gallops.   PULMONARY: Bilateral air entry. No wheezing or rales.   ABDOMEN: Soft, obese. Bowel sounds present. Diffuse tenderness. No rigidity or rebound. No organomegaly.  EXTREMITIES: No edema, clubbing, or cyanosis. No calf tenderness. Strong bilateral pedal pulses.   SKIN: No rash or jaundice.   NEUROLOGY: Alert and oriented x3. No focal deficit. Power 5 out of 5. Sensation intact. Deep tendon reflexes 2+.   LABORATORY, DIAGNOSTIC, AND RADIOLOGICAL DATA: Urinalysis negative, ketone  negative, glucose more than 500. Chest x-ray no acute disease. WBC 14.4, hemoglobin 12.3, platelets 579, glucose 404, BUN 3, creatinine 1.44, potassium 2.6, sodium 133, chloride 95, bicarb 15, SGOT 22, SGPT 30, anion gap 23. Troponin less than 0.02. EKG showed accelerated junction rhythm at 110 bpm, right axis deviation, pulmonary disease.   IMPRESSION:   1. DKA.  2. Uncontrolled diabetes.  3. Hypokalemia.  4. Leukocytosis possibly due to gastroenteritis or reaction.  5. Possible gastroenteritis but need to rule out lithium toxicity.  6. Hypertension, uncontrolled.  7. Coronary artery disease.  8. History of asthma.   PLAN OF TREATMENT:  1. The patient will be admitted to tele floor.  2. Will give clear liquid, IV fluid support, and Zofran p.r.n.  3. Morphine p.r.n.  4. We will hold lithium and check level.  5. Follow-up BMP, magnesium level, CBC, and TSH.  6. We will check stool CDT, culture, ova and parasite.  7. For DKA, we will increase the Lantus to 20 units sub-Q at bedtime and start sliding scale. Check hemoglobin A1c. Give D50 p.r.n. if blood sugar less than 60. 8. GI and DVT prophylaxis. 9. Continue hypertension medication, Lopressor, but hold spironolactone due to DKA.  10. For CAD, continue Ranexa and will give aspirin.  Discussed the patient's situation and the plan of treatment with the patient. She voiced understanding and agreed to the current treatment plan.   TIME SPENT: About 70 minutes.   ____________________________ Demetrios Loll, MD qc:drc D: 09/16/2011 20:49:04 ET T: 09/17/2011 06:13:07 ET JOB#: [id number]  cc: Demetrios Loll, MD, <Dictator> Marne A. Glori Bickers, MD Demetrios Loll MD ELECTRONICALLY SIGNED 09/19/2011 16:31
